# Patient Record
Sex: Male | Born: 1960 | Race: Black or African American | Hispanic: No | Smoking: Former smoker
Health system: Southern US, Community
[De-identification: ages and names within clinical notes are randomized; demographics above are authoritative.]

## PROBLEM LIST (undated history)

## (undated) DIAGNOSIS — F329 Major depressive disorder, single episode, unspecified: Secondary | ICD-10-CM

## (undated) DIAGNOSIS — F32A Depression, unspecified: Secondary | ICD-10-CM

## (undated) DIAGNOSIS — F419 Anxiety disorder, unspecified: Secondary | ICD-10-CM

## (undated) DIAGNOSIS — I10 Essential (primary) hypertension: Secondary | ICD-10-CM

## (undated) DIAGNOSIS — F909 Attention-deficit hyperactivity disorder, unspecified type: Secondary | ICD-10-CM

---

## 2006-04-16 ENCOUNTER — Emergency Department (HOSPITAL_COMMUNITY): Admission: EM | Admit: 2006-04-16 | Discharge: 2006-04-16 | Payer: Self-pay | Admitting: Emergency Medicine

## 2006-05-12 ENCOUNTER — Emergency Department (HOSPITAL_COMMUNITY): Admission: EM | Admit: 2006-05-12 | Discharge: 2006-05-12 | Payer: Self-pay | Admitting: *Deleted

## 2007-09-03 ENCOUNTER — Emergency Department (HOSPITAL_COMMUNITY): Admission: EM | Admit: 2007-09-03 | Discharge: 2007-09-03 | Payer: Self-pay | Admitting: Emergency Medicine

## 2007-10-20 ENCOUNTER — Ambulatory Visit: Payer: Self-pay | Admitting: Internal Medicine

## 2007-10-21 ENCOUNTER — Ambulatory Visit: Payer: Self-pay | Admitting: *Deleted

## 2008-02-29 ENCOUNTER — Ambulatory Visit: Payer: Self-pay | Admitting: Internal Medicine

## 2008-03-06 ENCOUNTER — Emergency Department (HOSPITAL_COMMUNITY): Admission: EM | Admit: 2008-03-06 | Discharge: 2008-03-06 | Payer: Self-pay | Admitting: Emergency Medicine

## 2008-12-28 IMAGING — CR DG CHEST 2V
2 series · 2 of 2 positions shown · non-contrast
Comparison: None.

CLINICAL DATA: Chest pain and pressure.

CHEST - 2 VIEW  09/03/2007:

[w chest pa]
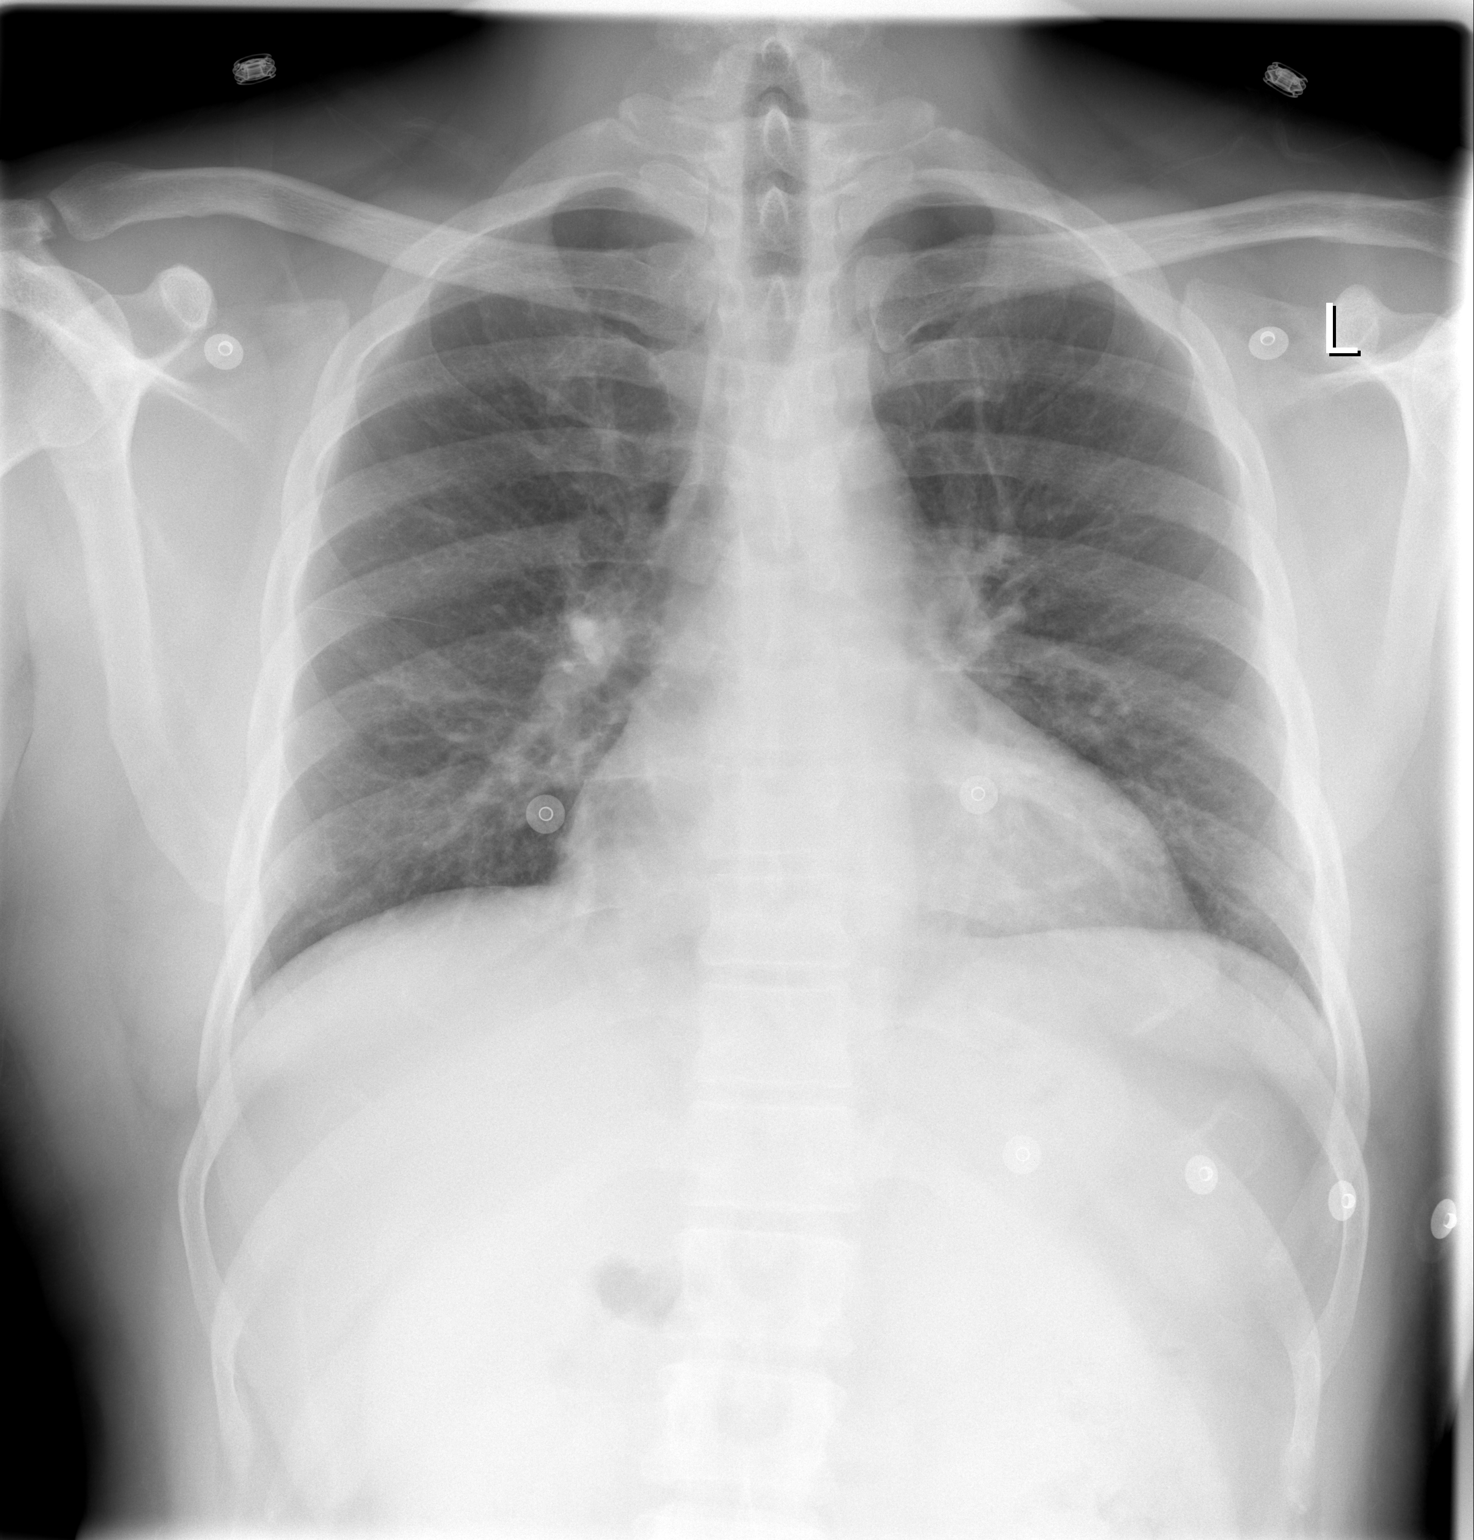

[w chest lat]
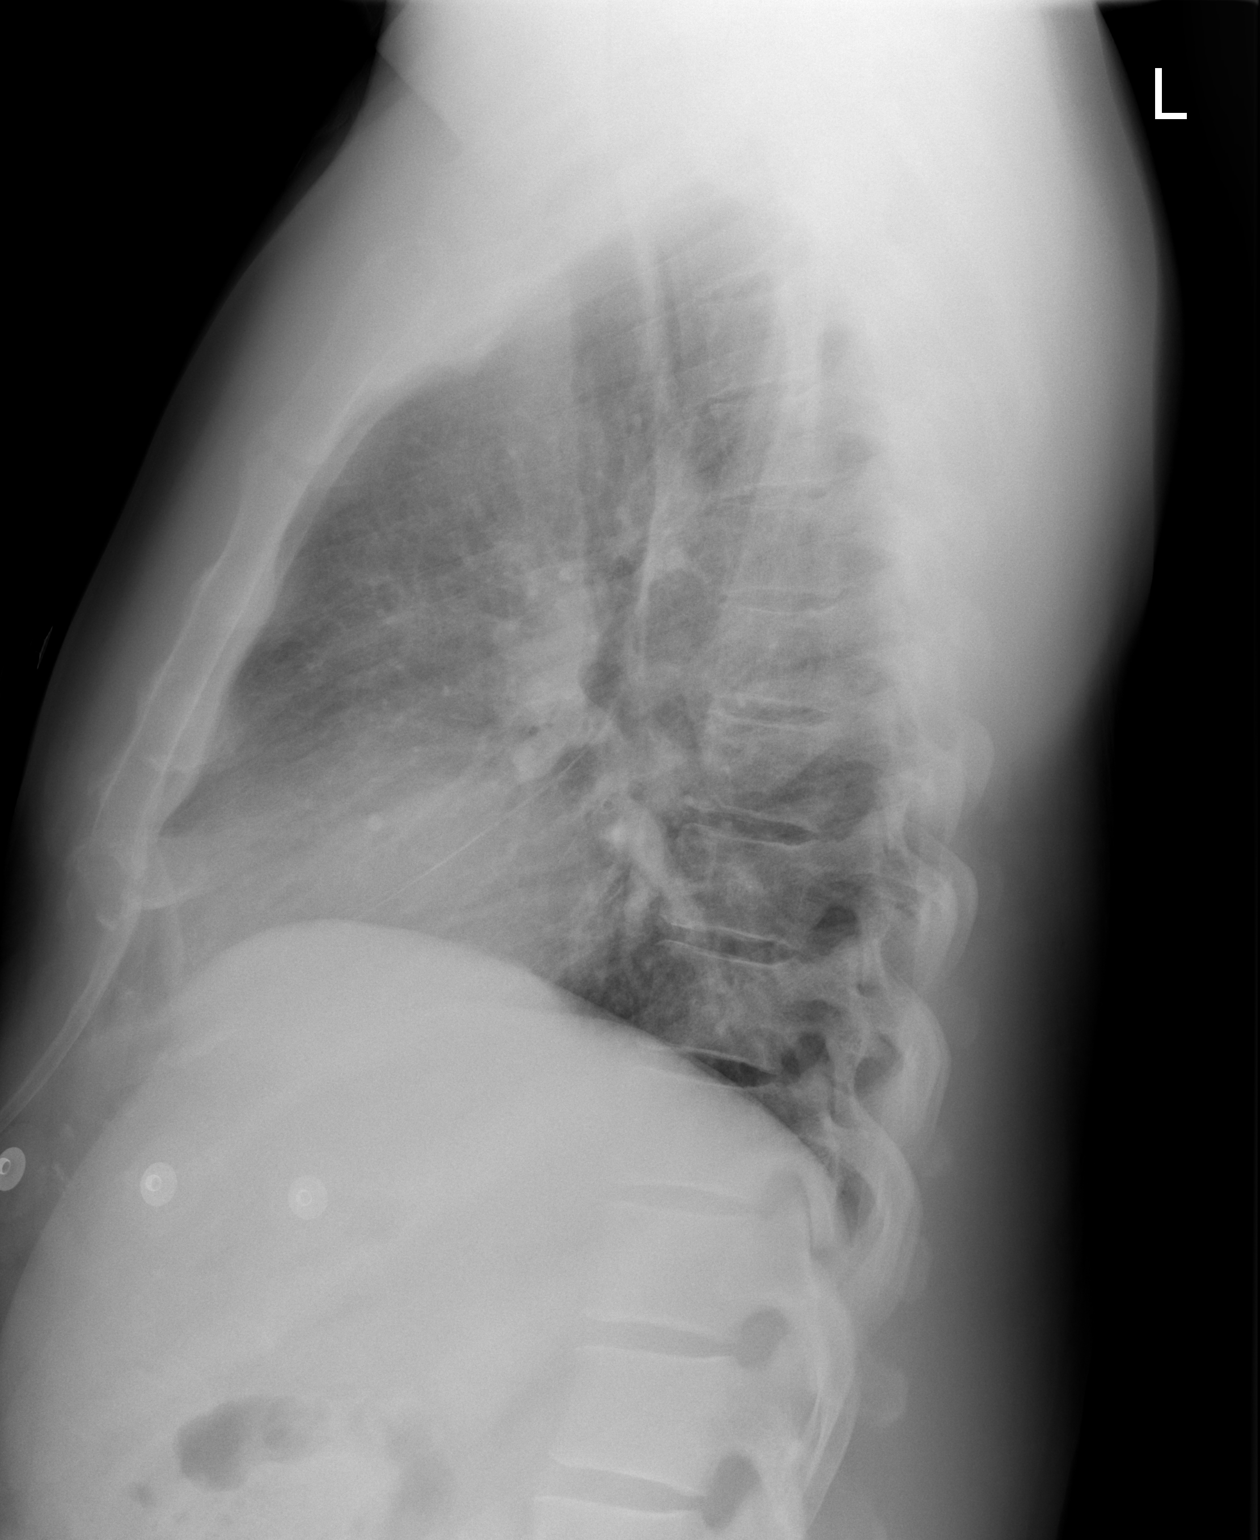

[2 of 2 positions shown; findings below may reference images not displayed]

FINDINGS: Inspiration suboptimal accounting for crowded bronchovascular
markings at the bases and accentuating the heart size; taking this into account,
heart mildly enlarged and pulmonary parenchyma clear. Pulmonary vascularity
normal. No pleural effusions. Visualized bony thorax intact.
IMPRESSION: Heart mildly enlarged for age. No acute cardiopulmonary disease.

## 2009-01-11 ENCOUNTER — Ambulatory Visit: Payer: Self-pay | Admitting: Nurse Practitioner

## 2009-01-11 DIAGNOSIS — R252 Cramp and spasm: Secondary | ICD-10-CM

## 2009-01-11 LAB — CONVERTED CEMR LAB
Albumin: 4.2 g/dL (ref 3.5–5.2)
BUN: 10 mg/dL (ref 6–23)
Basophils Absolute: 0 10*3/uL (ref 0.0–0.1)
Basophils Relative: 0 % (ref 0–1)
Bilirubin Urine: NEGATIVE
CO2: 19 meq/L (ref 19–32)
CRP, High Sensitivity: 7.9 — ABNORMAL HIGH
Calcium: 9.2 mg/dL (ref 8.4–10.5)
Eosinophils Absolute: 0.2 10*3/uL (ref 0.0–0.7)
Glucose, Bld: 87 mg/dL (ref 70–99)
Glucose, Urine, Semiquant: NEGATIVE
HCV Ab: NEGATIVE
Homocysteine: 10.2 micromoles/L (ref 4.0–15.4)
Ketones, urine, test strip: NEGATIVE
Lymphocytes Relative: 33 % (ref 12–46)
Lymphs Abs: 2.6 10*3/uL (ref 0.7–4.0)
MCV: 82.2 fL (ref 78.0–100.0)
Neutro Abs: 4.5 10*3/uL (ref 1.7–7.7)
Neutrophils Relative %: 56 % (ref 43–77)
Nitrite: NEGATIVE
Platelets: 303 10*3/uL (ref 150–400)
Specific Gravity, Urine: <1.005
Total Bilirubin: 0.5 mg/dL (ref 0.3–1.2)
Urobilinogen, UA: 0.2
WBC: 8 10*3/uL (ref 4.0–10.5)
pH: 7

## 2009-02-08 ENCOUNTER — Ambulatory Visit: Payer: Self-pay | Admitting: Nurse Practitioner

## 2009-02-08 DIAGNOSIS — K921 Melena: Secondary | ICD-10-CM

## 2009-02-08 LAB — CONVERTED CEMR LAB
Bilirubin Urine: NEGATIVE
Glucose, Urine, Semiquant: NEGATIVE
Nitrite: NEGATIVE
OCCULT 1: POSITIVE
Specific Gravity, Urine: <1.005
pH: 6.5

## 2009-02-26 ENCOUNTER — Encounter (INDEPENDENT_AMBULATORY_CARE_PROVIDER_SITE_OTHER): Payer: Self-pay | Admitting: Nurse Practitioner

## 2011-06-12 LAB — URINALYSIS, ROUTINE W REFLEX MICROSCOPIC
Bilirubin Urine: NEGATIVE
Specific Gravity, Urine: 1.003 — ABNORMAL LOW
Urobilinogen, UA: 0.2

## 2011-06-12 LAB — CBC
HCT: 45.4
RBC: 5.51

## 2011-06-12 LAB — DIFFERENTIAL
Basophils Absolute: 0
Basophils Relative: 0
Eosinophils Absolute: 0.3
Lymphocytes Relative: 20
Lymphs Abs: 1.8
Monocytes Absolute: 0.8
Monocytes Relative: 9
Neutro Abs: 6.2

## 2011-06-12 LAB — POCT I-STAT, CHEM 8
BUN: 9
Chloride: 106
Creatinine, Ser: 1.3
Glucose, Bld: 111 — ABNORMAL HIGH
Potassium: 3.7
Sodium: 139

## 2011-06-20 LAB — POCT CARDIAC MARKERS
CKMB, poc: 3.9
CKMB, poc: 4.5
Myoglobin, poc: 164
Myoglobin, poc: 197
Operator id: 161631
Operator id: 161631
Operator id: 161631
Troponin i, poc: <0.05

## 2011-06-20 LAB — I-STAT 8, (EC8 V) (CONVERTED LAB)
BUN: 13
Chloride: 108
HCT: 50

## 2012-08-27 ENCOUNTER — Emergency Department (HOSPITAL_COMMUNITY)
Admission: EM | Admit: 2012-08-27 | Discharge: 2012-08-27 | Discharge status: Against Medical Advice | Payer: Self-pay | Attending: Emergency Medicine | Admitting: Emergency Medicine

## 2012-08-27 ENCOUNTER — Encounter (HOSPITAL_COMMUNITY): Payer: Self-pay | Admitting: Emergency Medicine

## 2012-08-27 DIAGNOSIS — R0602 Shortness of breath: Secondary | ICD-10-CM | POA: Insufficient documentation

## 2012-08-27 DIAGNOSIS — Y929 Unspecified place or not applicable: Secondary | ICD-10-CM | POA: Insufficient documentation

## 2012-08-27 DIAGNOSIS — T17208A Unspecified foreign body in pharynx causing other injury, initial encounter: Secondary | ICD-10-CM | POA: Insufficient documentation

## 2012-08-27 DIAGNOSIS — IMO0002 Reserved for concepts with insufficient information to code with codable children: Secondary | ICD-10-CM | POA: Insufficient documentation

## 2012-08-27 DIAGNOSIS — Y9389 Activity, other specified: Secondary | ICD-10-CM | POA: Insufficient documentation

## 2012-08-27 HISTORY — DX: Major depressive disorder, single episode, unspecified: F32.9

## 2012-08-27 HISTORY — DX: Essential (primary) hypertension: I10

## 2012-08-27 HISTORY — DX: Depression, unspecified: F32.A

## 2012-08-27 HISTORY — DX: Attention-deficit hyperactivity disorder, unspecified type: F90.9

## 2012-08-27 NOTE — ED Notes (Signed)
No answer x3

## 2012-08-27 NOTE — ED Notes (Signed)
Pt states he was eating chicken approx 40 min ago and feels like bone stuck in throat.  States he was sob earlier but denies at this time.  Denies sore throat.  States he can feel it when swallowing.  Denies difficulty swallowing.  Patient handling secretions without difficulty.

## 2012-12-04 ENCOUNTER — Emergency Department (HOSPITAL_COMMUNITY)
Admission: EM | Admit: 2012-12-04 | Discharge: 2012-12-04 | Disposition: A | Discharge status: Home / Self Care | Payer: Self-pay | Attending: Emergency Medicine | Admitting: Emergency Medicine

## 2012-12-04 ENCOUNTER — Encounter (HOSPITAL_COMMUNITY): Payer: Self-pay | Admitting: *Deleted

## 2012-12-04 DIAGNOSIS — F329 Major depressive disorder, single episode, unspecified: Secondary | ICD-10-CM | POA: Insufficient documentation

## 2012-12-04 DIAGNOSIS — R6889 Other general symptoms and signs: Secondary | ICD-10-CM | POA: Insufficient documentation

## 2012-12-04 DIAGNOSIS — R51 Headache: Secondary | ICD-10-CM | POA: Insufficient documentation

## 2012-12-04 DIAGNOSIS — J00 Acute nasopharyngitis [common cold]: Secondary | ICD-10-CM | POA: Insufficient documentation

## 2012-12-04 DIAGNOSIS — Z7982 Long term (current) use of aspirin: Secondary | ICD-10-CM | POA: Insufficient documentation

## 2012-12-04 DIAGNOSIS — R519 Headache, unspecified: Secondary | ICD-10-CM

## 2012-12-04 DIAGNOSIS — Z87891 Personal history of nicotine dependence: Secondary | ICD-10-CM | POA: Insufficient documentation

## 2012-12-04 DIAGNOSIS — F3289 Other specified depressive episodes: Secondary | ICD-10-CM | POA: Insufficient documentation

## 2012-12-04 DIAGNOSIS — R42 Dizziness and giddiness: Secondary | ICD-10-CM | POA: Insufficient documentation

## 2012-12-04 DIAGNOSIS — R059 Cough, unspecified: Secondary | ICD-10-CM | POA: Insufficient documentation

## 2012-12-04 DIAGNOSIS — R05 Cough: Secondary | ICD-10-CM | POA: Insufficient documentation

## 2012-12-04 DIAGNOSIS — I10 Essential (primary) hypertension: Secondary | ICD-10-CM | POA: Insufficient documentation

## 2012-12-04 DIAGNOSIS — F909 Attention-deficit hyperactivity disorder, unspecified type: Secondary | ICD-10-CM | POA: Insufficient documentation

## 2012-12-04 DIAGNOSIS — R5383 Other fatigue: Secondary | ICD-10-CM | POA: Insufficient documentation

## 2012-12-04 DIAGNOSIS — R5381 Other malaise: Secondary | ICD-10-CM | POA: Insufficient documentation

## 2012-12-04 DIAGNOSIS — Z79899 Other long term (current) drug therapy: Secondary | ICD-10-CM | POA: Insufficient documentation

## 2012-12-04 MED ORDER — GUAIFENESIN ER 1200 MG PO TB12: 1.0000 | ORAL_TABLET | Freq: Two times a day (BID) | ORAL | Status: DC

## 2012-12-04 MED ORDER — KETOROLAC TROMETHAMINE 30 MG/ML IJ SOLN
30.0000 mg | Freq: Once | INTRAMUSCULAR | Status: AC
  Administered 2012-12-04: 30 mg via INTRAVENOUS
  Filled 2012-12-04: qty 1

## 2012-12-04 MED ORDER — SODIUM CHLORIDE 0.9 % IV BOLUS (SEPSIS)
1000.0000 mL | Freq: Once | INTRAVENOUS | Status: AC
  Administered 2012-12-04: 1000 mL via INTRAVENOUS

## 2012-12-04 MED ORDER — IBUPROFEN 800 MG PO TABS: 800.0000 mg | ORAL_TABLET | Freq: Three times a day (TID) | ORAL | Status: DC | PRN

## 2012-12-04 NOTE — ED Notes (Signed)
Ever since Thurs pt has been feeling like his head was "tight", pressure behind his eyes, general malaise and dizziness.  He thinks his pressures are good today b/c he took a 325 mg asa before coming.

## 2012-12-04 NOTE — ED Provider Notes (Signed)
History     CSN: [id number]  Arrival date & time 12/04/12  Dylan Burgess   First MD Initiated Contact with Patient 12/04/12 1912      Chief Complaint  Patient presents with  . Headache    (Consider location/radiation/quality/duration/timing/severity/associated sxs/prior treatment) HPI Dylan Burgess is a 52 year old male with a history of HTN who presents to the ED with a headache. He states that 2 days ago he developed coryza, cough, sneezing, and general malaise. Yesterday he developed a headache that he states is a "tightness" in the posterior head and pressure behind his eyes. Today he developed some dizziness and feeling lightheaded. He denies near syncope. Today he took an ASA, which relieved the pain from a 10/10 to a 3/10. He denies diplopia, vision changes, trauma, pharyngitis, nausea, vomiting, and neck pain.  Past Medical History  Diagnosis Date  . ADHD (attention deficit hyperactivity disorder)   . Depression   . Hypertension     History reviewed. No pertinent past surgical history.  No family history on file.  History  Substance Use Topics  . Smoking status: Former Games developer  . Smokeless tobacco: Not on file  . Alcohol Use: No      Review of Systems All other systems negative except as documented in the HPI. All pertinent positives and negatives as reviewed in the HPI. Allergies  Review of patient's allergies indicates no known allergies.  Home Medications   Current Outpatient Rx  Name  Route  Sig  Dispense  Refill  . aspirin EC 325 MG tablet   Oral   Take 325 mg by mouth every 6 (six) hours as needed for pain.         . hydrOXYzine (ATARAX/VISTARIL) 25 MG tablet   Oral   Take 50 mg by mouth 3 (three) times daily as needed for itching or anxiety.         . methylphenidate (RITALIN) 20 MG tablet   Oral   Take 20 mg by mouth 2 (two) times daily.         . sertraline (ZOLOFT) 50 MG tablet   Oral   Take 50 mg by mouth daily.         . traZODone  (DESYREL) 100 MG tablet   Oral   Take 100 mg by mouth at bedtime.           BP 138/83  Pulse 75  Temp(Src) 98.5 F (36.9 C) (Oral)  Resp 16  SpO2 95%  Physical Exam  Nursing note and vitals reviewed. Constitutional: He is oriented to person, place, and time. He appears well-developed and well-nourished.  HENT:  Head: Normocephalic and atraumatic.  Mouth/Throat: Oropharynx is clear and moist. No oropharyngeal exudate.  Eyes: EOM are normal. Pupils are equal, round, and reactive to light.  Neck: Normal range of motion. Neck supple.  Cardiovascular: Normal rate, regular rhythm and normal heart sounds.  Exam reveals no gallop and no friction rub.   No murmur heard. Pulmonary/Chest: Effort normal and breath sounds normal.  Neurological: He is alert and oriented to person, place, and time.  Skin: Skin is warm and dry. No rash noted.    ED Course  Procedures (including critical care time) The patient will be treated for URI symptoms. Told to return here as needed.    MDM         Carlyle Dolly, PA-C 12/05/12 1016

## 2012-12-05 NOTE — ED Provider Notes (Signed)
Medical screening examination/treatment/procedure(s) were performed by non-physician practitioner and as supervising physician I was immediately available for consultation/collaboration.   Richardean Canal, MD 12/05/12 [phone]

## 2015-03-22 ENCOUNTER — Encounter (HOSPITAL_COMMUNITY): Payer: Self-pay | Admitting: Emergency Medicine

## 2015-03-22 DIAGNOSIS — M6281 Muscle weakness (generalized): Secondary | ICD-10-CM | POA: Insufficient documentation

## 2015-03-22 DIAGNOSIS — I10 Essential (primary) hypertension: Secondary | ICD-10-CM | POA: Insufficient documentation

## 2015-03-22 DIAGNOSIS — R51 Headache: Secondary | ICD-10-CM | POA: Insufficient documentation

## 2015-03-22 MED ORDER — OXYCODONE-ACETAMINOPHEN 5-325 MG PO TABS
ORAL_TABLET | ORAL | Status: AC
  Filled 2015-03-22: qty 1

## 2015-03-22 MED ORDER — OXYCODONE-ACETAMINOPHEN 5-325 MG PO TABS
1.0000 | ORAL_TABLET | Freq: Once | ORAL | Status: AC
  Administered 2015-03-22: 1 via ORAL

## 2015-03-22 NOTE — ED Notes (Addendum)
Pt states- "I have been taking some old ritalin that I used to take, and then I ate five kit kats. After that I felt a rush. I have anxiety attacks that I used to take medication for but I cannot afford it so now I am trying to treat myself by taking Zoloft, Trazodone, Ritalin and something that starts with a Ph but today I only took the Ritalin to focus on reading." Pt reports after this he took a hot bath and then when he got out he felt stress in the back of his neck and head, left leg and left arm heaviness. Still have the heaviness but it has gotten a little better. Pt also reports neck stiffness that is less intense than it was earlier. Pt states he then "took a 57 Asprin for his nerves."  Pt is alert and oriented X4. No neuro deficits noted.  Denies Chest pain/SOB/N/V/D.

## 2015-03-23 ENCOUNTER — Emergency Department (HOSPITAL_COMMUNITY)
Admission: EM | Admit: 2015-03-23 | Discharge: 2015-03-23 | Discharge status: Against Medical Advice | Payer: Self-pay | Attending: Emergency Medicine | Admitting: Emergency Medicine

## 2015-03-23 HISTORY — DX: Anxiety disorder, unspecified: F41.9

## 2015-03-23 NOTE — ED Notes (Signed)
Called pt to update pts vital signs in waiting room with no answer. Pt called twice

## 2015-03-23 NOTE — ED Notes (Signed)
Pt called for room placement with no answer. 

## 2017-06-21 ENCOUNTER — Emergency Department (HOSPITAL_COMMUNITY): Payer: Self-pay

## 2017-06-21 ENCOUNTER — Encounter (HOSPITAL_COMMUNITY): Payer: Self-pay | Admitting: Emergency Medicine

## 2017-06-21 ENCOUNTER — Emergency Department (HOSPITAL_COMMUNITY)
Admission: EM | Admit: 2017-06-21 | Discharge: 2017-06-21 | Disposition: A | Discharge status: Home / Self Care | Payer: Self-pay | Attending: Emergency Medicine | Admitting: Emergency Medicine

## 2017-06-21 DIAGNOSIS — F909 Attention-deficit hyperactivity disorder, unspecified type: Secondary | ICD-10-CM | POA: Insufficient documentation

## 2017-06-21 DIAGNOSIS — E860 Dehydration: Secondary | ICD-10-CM | POA: Insufficient documentation

## 2017-06-21 DIAGNOSIS — Z87891 Personal history of nicotine dependence: Secondary | ICD-10-CM | POA: Insufficient documentation

## 2017-06-21 DIAGNOSIS — Z79899 Other long term (current) drug therapy: Secondary | ICD-10-CM | POA: Insufficient documentation

## 2017-06-21 DIAGNOSIS — I10 Essential (primary) hypertension: Secondary | ICD-10-CM | POA: Insufficient documentation

## 2017-06-21 DIAGNOSIS — R55 Syncope and collapse: Secondary | ICD-10-CM | POA: Insufficient documentation

## 2017-06-21 LAB — CBC WITH DIFFERENTIAL/PLATELET
BASOS ABS: 0 10*3/uL (ref 0.0–0.1)
BASOS PCT: 0 %
EOS ABS: 0.2 10*3/uL (ref 0.0–0.7)
EOS PCT: 2 %
HCT: 41.6 % (ref 39.0–52.0)
Hemoglobin: 13.5 g/dL (ref 13.0–17.0)
Lymphocytes Relative: 22 %
Lymphs Abs: 1.9 10*3/uL (ref 0.7–4.0)
MCH: 27.1 pg (ref 26.0–34.0)
MCHC: 32.5 g/dL (ref 30.0–36.0)
MCV: 83.4 fL (ref 78.0–100.0)
MONO ABS: 0.5 10*3/uL (ref 0.1–1.0)
MONOS PCT: 6 %
NEUTROS ABS: 5.9 10*3/uL (ref 1.7–7.7)
Neutrophils Relative %: 70 %
PLATELETS: 232 10*3/uL (ref 150–400)
RBC: 4.99 MIL/uL (ref 4.22–5.81)
RDW: 13.6 % (ref 11.5–15.5)
WBC: 8.5 10*3/uL (ref 4.0–10.5)

## 2017-06-21 LAB — COMPREHENSIVE METABOLIC PANEL
ALK PHOS: 65 U/L (ref 38–126)
ALT: 20 U/L (ref 17–63)
AST: 21 U/L (ref 15–41)
Albumin: 3.7 g/dL (ref 3.5–5.0)
Anion gap: 6 (ref 5–15)
BUN: 14 mg/dL (ref 6–20)
CALCIUM: 8.3 mg/dL — AB (ref 8.9–10.3)
CHLORIDE: 107 mmol/L (ref 101–111)
CO2: 25 mmol/L (ref 22–32)
CREATININE: 1.33 mg/dL — AB (ref 0.61–1.24)
GFR calc Af Amer: >60 mL/min (ref >60)
GFR calc non Af Amer: 60 mL/min — ABNORMAL LOW (ref >60)
GLUCOSE: 135 mg/dL — AB (ref 65–99)
Potassium: 3.7 mmol/L (ref 3.5–5.1)
SODIUM: 138 mmol/L (ref 135–145)
Total Bilirubin: 0.6 mg/dL (ref 0.3–1.2)
Total Protein: 7 g/dL (ref 6.5–8.1)

## 2017-06-21 LAB — URINALYSIS, ROUTINE W REFLEX MICROSCOPIC
BILIRUBIN URINE: NEGATIVE
GLUCOSE, UA: NEGATIVE mg/dL
HGB URINE DIPSTICK: NEGATIVE
Ketones, ur: NEGATIVE mg/dL
Leukocytes, UA: NEGATIVE
Nitrite: NEGATIVE
PH: 7 (ref 5.0–8.0)
Protein, ur: NEGATIVE mg/dL
SPECIFIC GRAVITY, URINE: 1.003 — AB (ref 1.005–1.030)

## 2017-06-21 LAB — I-STAT TROPONIN, ED
Troponin i, poc: 0 ng/mL (ref 0.00–0.08)
Troponin i, poc: 0 ng/mL (ref 0.00–0.08)

## 2017-06-21 NOTE — ED Triage Notes (Signed)
Per EMS: Pt was standing up using the bathroom and when he finished urinating he slumped to the ground. Wife witnessed.  Pt denies head/neck/back pain. Initial BP was 60/26 w/ HR 38, after 1000NS 122/72 w/ HR 60. A&Ox4 VSS EKG NSR CBG 118

## 2017-06-21 NOTE — ED Notes (Signed)
Pt understood dc material and follow up instructions. NAD noted

## 2017-06-21 NOTE — Discharge Instructions (Signed)
Please stay hydrated. Please follow-up with a primary care physician for reassessment and further management in the next few days. If any symptoms change or worsen, please return to the nearest emergency department.

## 2017-06-21 NOTE — ED Provider Notes (Signed)
[facility]-EMERGENCY DEPT Provider Note   CSN: [id number] Arrival date & time: 06/21/17  1311     History   Chief Complaint Chief Complaint  Patient presents with  . Loss of Consciousness    HPI Dylan Burgess is a 56 y.o. male.  The history is provided by the patient. No language interpreter was used.  Loss of Consciousness   This is a new problem. The current episode started less than 1 hour ago. The problem occurs constantly. The problem has been gradually improving. There was no loss of consciousness. Associated symptoms include bladder incontinence. He has tried nothing for the symptoms. His past medical history does not include CVA.  Pt reports he felt nauseated.  Pt thinks he may have gotten dehydrated from not drinking enough fluids.  Pt reports he felt like he was going to vomit and like he needed to urinate. Pt's wife reports pt urinated on himself. She called EMS.  Pt reports he had a period of time that he could here EMS but could not answer.  Pt complains of feeling thirsty.  Past Medical History:  Diagnosis Date  . ADHD (attention deficit hyperactivity disorder)   . Anxiety   . Depression   . Hypertension     Patient Active Problem List   Diagnosis Date Noted  . HEMOCCULT POSITIVE STOOL 02/08/2009  . HYPERTENSION, BENIGN ESSENTIAL 01/11/2009  . MUSCLE CRAMPS 01/11/2009    History reviewed. No pertinent surgical history.     Home Medications    Prior to Admission medications   Medication Sig Start Date End Date Taking? Authorizing Provider  aspirin EC 325 MG tablet Take 325 mg by mouth every 6 (six) hours as needed for pain.    [provider]  Guaifenesin 1200 MG TB12 Take 1 tablet (1,200 mg total) by mouth 2 (two) times daily. 12/04/12   Lawyer, Cristal Deer, PA-C  hydrOXYzine (ATARAX/VISTARIL) 25 MG tablet Take 50 mg by mouth 3 (three) times daily as needed for itching or anxiety.    [provider]  ibuprofen (ADVIL,MOTRIN) 800 MG tablet  Take 1 tablet (800 mg total) by mouth every 8 (eight) hours as needed for pain. 12/04/12   Lawyer, Cristal Deer, PA-C  methylphenidate (RITALIN) 20 MG tablet Take 20 mg by mouth 2 (two) times daily.    [provider]  sertraline (ZOLOFT) 50 MG tablet Take 50 mg by mouth daily.    [provider]  traZODone (DESYREL) 100 MG tablet Take 100 mg by mouth at bedtime.    [provider]    Family History No family history on file.  Social History Social History  Substance Use Topics  . Smoking status: Former Games developer  . Smokeless tobacco: Never Used  . Alcohol use No     Allergies   Patient has no known allergies.   Review of Systems Review of Systems  Cardiovascular: Positive for syncope.  Genitourinary: Positive for bladder incontinence.  All other systems reviewed and are negative.    Physical Exam Updated Vital Signs BP 126/79   Pulse 69   Temp (!) 97.5 F (36.4 C) (Oral)   Resp 13   Ht 6' (1.829 m)   Wt 95.3 kg (210 lb)   SpO2 100%   BMI 28.48 kg/m   Physical Exam  Constitutional: He appears well-developed and well-nourished.  HENT:  Head: Normocephalic and atraumatic.  Eyes: Conjunctivae are normal.  Neck: Neck supple.  Cardiovascular: Normal rate and regular rhythm.   No murmur heard. Pulmonary/Chest:  Effort normal and breath sounds normal. No respiratory distress.  Abdominal: Soft. There is no tenderness.  Musculoskeletal: He exhibits no edema.  Neurological: He is alert.  Skin: Skin is warm and dry.  Psychiatric: He has a normal mood and affect.  Nursing note and vitals reviewed.    ED Treatments / Results  Labs (all labs ordered are listed, but only abnormal results are displayed) Labs Reviewed  CBC WITH DIFFERENTIAL/PLATELET  COMPREHENSIVE METABOLIC PANEL  URINALYSIS, ROUTINE W REFLEX MICROSCOPIC  I-STAT TROPONIN, ED    EKG  EKG Interpretation  Date/Time:  Sunday June 21 2017 13:19:51 EDT Ventricular Rate:   60 PR Interval:    QRS Duration: 104 QT Interval:  424 QTC Calculation: 424 R Axis:   -25 Text Interpretation:  Sinus rhythm Prolonged PR interval Borderline left axis deviation Since last tracing rate slower Confirmed by Eber Hong ([id number]) on 06/21/2017 1:24:11 PM       Radiology No results found.  Procedures Procedures (including critical care time)  Medications Ordered in ED Medications - No data to display   Initial Impression / Assessment and Plan / ED Course  I have reviewed the triage vital signs and the nursing notes.  Pertinent labs & imaging results that were available during my care of the patient were reviewed by me and considered in my medical decision making (see chart for details).       Final Clinical Impressions(s) / ED Diagnoses   Final diagnoses:  None    New Prescriptions New Prescriptions   No medications on file     Osie Cheeks 06/21/17 1601    Eber Hong, MD 06/23/17 2014

## 2017-06-21 NOTE — ED Provider Notes (Signed)
Care assumed from [employer] PA-C.   Patient is awaiting delta troponin and reassessment. Patient reports having a near-syncopal episode after being dehydrated and having urination. Patient was given fluids by EMS and has not been hypotensive during his emergency department stay. At time of transfer of care, plan to discharge patient if troponin is negative 2 and his symptoms have improved and he tolerates rehydration orally.   The troponin was negative. Patient's workup was reviewed and patient is feeling better. Patient was able to tolerate a full meal and drinking without difficulty. Patient was able to ambulate without feeling lightheaded or having any further near syncopal episodes. Patient continues to deny chest pain or shortness of breath.   Given reassuring workup, do not feel patient needs admission at this time. Patient encouraged to stay hydrated and follow-up with his PCP. Patient had no other questions or concerns and understood return precautions. Patient discharged in good condition.     Clinical Impression: 1. Near syncope   2. Dehydration     Disposition: Discharge  Condition: Good  I have discussed the results, Dx and Tx plan with the pt(& family if present). He/she/they expressed understanding and agree(s) with the plan. Discharge instructions discussed at great length. Strict return precautions discussed and pt &/or family have verbalized understanding of the instructions. No further questions at time of discharge.    Discharge Medication List as of 06/21/2017  9:20 PM      Follow Up: [facility] [address] [place] [address] [phone] Schedule an appointment as soon as possible for a visit    [facility] EMERGENCY DEPARTMENT [address] [id number] mc [place] [address] [phone]  If symptoms worsen     Kaavya Puskarich, Canary Brim, MD 06/22/17 0025

## 2017-12-01 ENCOUNTER — Emergency Department (HOSPITAL_COMMUNITY)
Admission: EM | Admit: 2017-12-01 | Discharge: 2017-12-01 | Disposition: A | Discharge status: Home / Self Care | Payer: Self-pay | Attending: Emergency Medicine | Admitting: Emergency Medicine

## 2017-12-01 ENCOUNTER — Encounter (HOSPITAL_COMMUNITY): Payer: Self-pay | Admitting: Emergency Medicine

## 2017-12-01 DIAGNOSIS — F909 Attention-deficit hyperactivity disorder, unspecified type: Secondary | ICD-10-CM | POA: Insufficient documentation

## 2017-12-01 DIAGNOSIS — Z87891 Personal history of nicotine dependence: Secondary | ICD-10-CM | POA: Insufficient documentation

## 2017-12-01 DIAGNOSIS — I10 Essential (primary) hypertension: Secondary | ICD-10-CM | POA: Insufficient documentation

## 2017-12-01 DIAGNOSIS — R002 Palpitations: Secondary | ICD-10-CM | POA: Insufficient documentation

## 2017-12-01 LAB — COMPREHENSIVE METABOLIC PANEL
ALBUMIN: 3.8 g/dL (ref 3.5–5.0)
ALK PHOS: 72 U/L (ref 38–126)
ALT: 21 U/L (ref 17–63)
ANION GAP: 12 (ref 5–15)
AST: 25 U/L (ref 15–41)
BUN: 13 mg/dL (ref 6–20)
CO2: 22 mmol/L (ref 22–32)
Calcium: 8.6 mg/dL — ABNORMAL LOW (ref 8.9–10.3)
Chloride: 104 mmol/L (ref 101–111)
Creatinine, Ser: 1.18 mg/dL (ref 0.61–1.24)
GFR calc Af Amer: >60 mL/min (ref >60)
GFR calc non Af Amer: >60 mL/min (ref >60)
GLUCOSE: 93 mg/dL (ref 65–99)
POTASSIUM: 3.4 mmol/L — AB (ref 3.5–5.1)
SODIUM: 138 mmol/L (ref 135–145)
Total Bilirubin: 0.3 mg/dL (ref 0.3–1.2)
Total Protein: 7.5 g/dL (ref 6.5–8.1)

## 2017-12-01 LAB — CBC WITH DIFFERENTIAL/PLATELET
BASOS ABS: 0 10*3/uL (ref 0.0–0.1)
Basophils Relative: 0 %
Eosinophils Absolute: 0.2 10*3/uL (ref 0.0–0.7)
Eosinophils Relative: 2 %
HEMATOCRIT: 47 % (ref 39.0–52.0)
Hemoglobin: 15.6 g/dL (ref 13.0–17.0)
LYMPHS ABS: 2.8 10*3/uL (ref 0.7–4.0)
LYMPHS PCT: 28 %
MCH: 27.9 pg (ref 26.0–34.0)
MCHC: 33.2 g/dL (ref 30.0–36.0)
MCV: 84.1 fL (ref 78.0–100.0)
Monocytes Absolute: 0.9 10*3/uL (ref 0.1–1.0)
Monocytes Relative: 9 %
NEUTROS ABS: 6 10*3/uL (ref 1.7–7.7)
Neutrophils Relative %: 61 %
Platelets: 284 10*3/uL (ref 150–400)
RBC: 5.59 MIL/uL (ref 4.22–5.81)
RDW: 13.8 % (ref 11.5–15.5)
WBC: 9.8 10*3/uL (ref 4.0–10.5)

## 2017-12-01 LAB — PROTIME-INR
INR: 0.97
Prothrombin Time: 12.8 seconds (ref 11.4–15.2)

## 2017-12-01 LAB — TROPONIN I: Troponin I: <0.03 ng/mL (ref <0.03)

## 2017-12-01 LAB — CBG MONITORING, ED: Glucose-Capillary: 85 mg/dL (ref 65–99)

## 2017-12-01 LAB — BRAIN NATRIURETIC PEPTIDE: B NATRIURETIC PEPTIDE 5: 13.7 pg/mL (ref 0.0–100.0)

## 2017-12-01 LAB — MAGNESIUM: Magnesium: 2.1 mg/dL (ref 1.7–2.4)

## 2017-12-01 MED ORDER — ASPIRIN 81 MG PO CHEW
324.0000 mg | CHEWABLE_TABLET | Freq: Once | ORAL | Status: AC
  Administered 2017-12-01: 324 mg via ORAL
  Filled 2017-12-01: qty 4

## 2017-12-01 NOTE — Discharge Instructions (Signed)
As discussed, today's evaluation has been generally reassuring, but there is some suspicion for an arrhythmia, or irregular heartbeat. It is important he follow-up with our cardiology colleagues for further evaluation and management.  Return here for any concerning changes on your condition.

## 2017-12-01 NOTE — ED Notes (Signed)
Pt refused d/c vitals.

## 2017-12-01 NOTE — ED Notes (Signed)
Pt ambulated to bathroom independently

## 2017-12-01 NOTE — ED Provider Notes (Signed)
[facility]   CSN: [id number] Arrival date & time:        History   Chief Complaint Chief Complaint  Patient presents with  . Tachycardia    HPI Dylan Burgess is a [age] y.o. male.  HPI Presents after an episode of chest pain with palpitations. Patient was in his usual state of health, notes that soon after going to his house after doing some manual labor, he felt onset of rapid heartbeat, palpitations, tightness across the chest. Symptoms lasted possibly 10 minutes, have resolved entirely, by the time of my evaluation. EMS notes that the patient in sinus rhythm, with tachycardia during transport, but no other arrhythmia on the monitor. Patient states that he is a healthy individual, denies chronic medical problems, denies recent changes in activity, diet, medicine. Currently again he denies any ongoing complaints including pain, lightheadedness.  Past Medical History:  Diagnosis Date  . ADHD (attention deficit hyperactivity disorder)   . Anxiety   . Depression   . Hypertension     Patient Active Problem List   Diagnosis Date Noted  . HEMOCCULT POSITIVE STOOL 02/08/2009  . HYPERTENSION, BENIGN ESSENTIAL 01/11/2009  . MUSCLE CRAMPS 01/11/2009    History reviewed. No pertinent surgical history.     Home Medications    Prior to Admission medications   Medication Sig Start Date End Date Taking? Authorizing Provider  Guaifenesin 1200 MG TB12 Take 1 tablet (1,200 mg total) by mouth 2 (two) times daily. Patient not taking: Reported on 06/21/2017 12/04/12   Charlestine NightLawyer, Christopher, PA-C  ibuprofen (ADVIL,MOTRIN) 800 MG tablet Take 1 tablet (800 mg total) by mouth every 8 (eight) hours as needed for pain. Patient not taking: Reported on 06/21/2017 12/04/12   Charlestine NightLawyer, Christopher, PA-C    Family History No family history on file.  Social History Social History   Tobacco Use  . Smoking status: Former Games developermoker  .  Smokeless tobacco: Never Used  Substance Use Topics  . Alcohol use: No  . Drug use: No     Allergies   Patient has no known allergies.   Review of Systems Review of Systems  Constitutional:       Per HPI, otherwise negative  HENT:       Per HPI, otherwise negative  Respiratory:       Per HPI, otherwise negative  Cardiovascular:       Per HPI, otherwise negative  Gastrointestinal: Negative for vomiting.  Endocrine:       Negative aside from HPI  Genitourinary:       Neg aside from HPI   Musculoskeletal:       Per HPI, otherwise negative  Skin: Negative.   Neurological: Negative for syncope.     Physical Exam Updated Vital Signs BP (!) 187/101 (BP Location: Right Arm)   Pulse 77   Temp 98.6 F (37 C) (Oral)   Resp 18   Ht 6' (1.829 m)   Wt 102.1 kg (225 lb)   SpO2 95%   BMI 30.52 kg/m   Physical Exam  Constitutional: He is oriented to person, place, and time. He appears well-developed. No distress.  HENT:  Head: Normocephalic and atraumatic.  Eyes: Conjunctivae and EOM are normal.  Cardiovascular: Normal rate, regular rhythm and intact distal pulses.  Pulmonary/Chest: Effort normal. No stridor. No respiratory distress.  Abdominal: He exhibits no distension.  Musculoskeletal: He exhibits no edema.  Neurological: He is alert and oriented to person, place, and time.  Skin: Skin is warm and dry.  Psychiatric: He has a normal mood and affect.  Nursing note and vitals reviewed.    ED Treatments / Results  Labs (all labs ordered are listed, but only abnormal results are displayed) Labs Reviewed  COMPREHENSIVE METABOLIC PANEL - Abnormal; Notable for the following components:      Result Value   Potassium 3.4 (*)    Calcium 8.6 (*)    All other components within normal limits  MAGNESIUM  TROPONIN I  CBC WITH DIFFERENTIAL/PLATELET  PROTIME-INR  BRAIN NATRIURETIC PEPTIDE  CBG MONITORING, ED    EKG  EKG Interpretation  Date/Time:  Tuesday December 01 2017 19:33:49 EDT Ventricular Rate:  73 PR Interval:  200 QRS Duration: 92 QT Interval:  402 QTC Calculation: 442 R Axis:   -28 Text Interpretation:  Normal sinus rhythm No significant change since last tracing Abnormal ekg Confirmed by Gerhard Munch [phone]) on 12/01/2017 8:53:51 PM       Procedures Procedures (including critical care time)  Medications Ordered in ED Medications  aspirin chewable tablet 324 mg (324 mg Oral Given 12/01/17 1933)     Initial Impression / Assessment and Plan / ED Course  I have reviewed the triage vital signs and the nursing notes.  Pertinent labs & imaging results that were available during my care of the patient were reviewed by me and considered in my medical decision making (see chart for details).  9:03 PM Patient states that he has to leave, he has a roommate with special needs that he needs to attend to. I discussed the risks of leaving prior to monitoring, second troponin, patient acknowledges understanding of these risks, states he will follow-up with our cardiology colleagues per That said, initial studies reassuring, with no ischemic changes on EKG, unremarkable initial labs, including normal troponin. Patient is also not hypoxic, tachycardic, tachypneic, there is low suspicion for other acute new phenomena such as pulmonary embolism. Given his resolution of symptoms prior to my evaluation, there is some suspicion for paroxysmal atrial fibrillation, but no objective evidence of this as he was asymptomatic while in the emergency department. Patient will follow up with cardiology for consideration of monitoring, in additional testing Patient discharged, per request, in stable condition.  Final Clinical Impressions(s) / ED Diagnoses   Final diagnoses:  Palpitations     Gerhard Munch, MD 12/01/17 2104

## 2017-12-01 NOTE — ED Triage Notes (Signed)
Per EMS pt felt heart racing  And SOB pt does have h/s of anxiety, episode resolved, no pain currently, 12 L unremarkable

## 2018-01-20 ENCOUNTER — Emergency Department (HOSPITAL_COMMUNITY)
Admission: EM | Admit: 2018-01-20 | Discharge: 2018-01-20 | Disposition: A | Discharge status: Home / Self Care | Payer: Self-pay | Attending: Emergency Medicine | Admitting: Emergency Medicine

## 2018-01-20 ENCOUNTER — Encounter (HOSPITAL_COMMUNITY): Payer: Self-pay

## 2018-01-20 DIAGNOSIS — F419 Anxiety disorder, unspecified: Secondary | ICD-10-CM | POA: Insufficient documentation

## 2018-01-20 DIAGNOSIS — Z5321 Procedure and treatment not carried out due to patient leaving prior to being seen by health care provider: Secondary | ICD-10-CM | POA: Insufficient documentation

## 2018-01-20 DIAGNOSIS — R1110 Vomiting, unspecified: Secondary | ICD-10-CM | POA: Insufficient documentation

## 2018-01-20 LAB — COMPREHENSIVE METABOLIC PANEL
ALK PHOS: 74 U/L (ref 38–126)
ALT: 18 U/L (ref 17–63)
ANION GAP: 10 (ref 5–15)
AST: 25 U/L (ref 15–41)
Albumin: 4 g/dL (ref 3.5–5.0)
BILIRUBIN TOTAL: 0.7 mg/dL (ref 0.3–1.2)
BUN: 11 mg/dL (ref 6–20)
CO2: 23 mmol/L (ref 22–32)
Calcium: 8.8 mg/dL — ABNORMAL LOW (ref 8.9–10.3)
Chloride: 106 mmol/L (ref 101–111)
Creatinine, Ser: 1.18 mg/dL (ref 0.61–1.24)
Glucose, Bld: 147 mg/dL — ABNORMAL HIGH (ref 65–99)
Potassium: 3.7 mmol/L (ref 3.5–5.1)
SODIUM: 139 mmol/L (ref 135–145)
TOTAL PROTEIN: 7.7 g/dL (ref 6.5–8.1)

## 2018-01-20 LAB — CBC
HEMATOCRIT: 45.7 % (ref 39.0–52.0)
Hemoglobin: 15.9 g/dL (ref 13.0–17.0)
MCH: 28.9 pg (ref 26.0–34.0)
MCHC: 34.8 g/dL (ref 30.0–36.0)
MCV: 83.1 fL (ref 78.0–100.0)
Platelets: 280 10*3/uL (ref 150–400)
RBC: 5.5 MIL/uL (ref 4.22–5.81)
RDW: 13.3 % (ref 11.5–15.5)
WBC: 9.8 10*3/uL (ref 4.0–10.5)

## 2018-01-20 LAB — LIPASE, BLOOD: Lipase: 50 U/L (ref 11–51)

## 2018-01-20 NOTE — ED Notes (Signed)
Called to reassess. No answer.

## 2018-01-20 NOTE — ED Triage Notes (Signed)
Pt comes via [facility] for a panic attack after he found a bat loose in his house, animal control unable to locate the bat, hx of panic attacks in the past, pt is anxious and vomited x 1 in triage.

## 2018-01-20 NOTE — ED Notes (Signed)
Called pt for VS and urine with no answer

## 2018-01-20 NOTE — ED Notes (Signed)
Called patient no answer.

## 2018-02-12 ENCOUNTER — Encounter (HOSPITAL_COMMUNITY): Payer: Self-pay | Admitting: Emergency Medicine

## 2018-02-12 ENCOUNTER — Emergency Department (HOSPITAL_COMMUNITY): Payer: Self-pay

## 2018-02-12 ENCOUNTER — Emergency Department (HOSPITAL_COMMUNITY)
Admission: EM | Admit: 2018-02-12 | Discharge: 2018-02-12 | Disposition: A | Discharge status: Home / Self Care | Payer: Self-pay | Attending: Emergency Medicine | Admitting: Emergency Medicine

## 2018-02-12 DIAGNOSIS — Z87891 Personal history of nicotine dependence: Secondary | ICD-10-CM | POA: Insufficient documentation

## 2018-02-12 DIAGNOSIS — R002 Palpitations: Secondary | ICD-10-CM | POA: Insufficient documentation

## 2018-02-12 DIAGNOSIS — I10 Essential (primary) hypertension: Secondary | ICD-10-CM | POA: Insufficient documentation

## 2018-02-12 DIAGNOSIS — R0602 Shortness of breath: Secondary | ICD-10-CM | POA: Insufficient documentation

## 2018-02-12 LAB — URINALYSIS, ROUTINE W REFLEX MICROSCOPIC
Bacteria, UA: NONE SEEN
Bilirubin Urine: NEGATIVE
Glucose, UA: NEGATIVE mg/dL
Ketones, ur: NEGATIVE mg/dL
Leukocytes, UA: NEGATIVE
Nitrite: NEGATIVE
Protein, ur: NEGATIVE mg/dL
SPECIFIC GRAVITY, URINE: 1.009 (ref 1.005–1.030)
pH: 6 (ref 5.0–8.0)

## 2018-02-12 LAB — CBC
HCT: 45 % (ref 39.0–52.0)
HEMOGLOBIN: 14.6 g/dL (ref 13.0–17.0)
MCH: 27.2 pg (ref 26.0–34.0)
MCHC: 32.4 g/dL (ref 30.0–36.0)
MCV: 83.8 fL (ref 78.0–100.0)
PLATELETS: 312 10*3/uL (ref 150–400)
RBC: 5.37 MIL/uL (ref 4.22–5.81)
RDW: 13.2 % (ref 11.5–15.5)
WBC: 10.3 10*3/uL (ref 4.0–10.5)

## 2018-02-12 LAB — HEPATIC FUNCTION PANEL
ALK PHOS: 66 U/L (ref 38–126)
ALT: 17 U/L (ref 17–63)
AST: 19 U/L (ref 15–41)
Albumin: 3.9 g/dL (ref 3.5–5.0)
Bilirubin, Direct: <0.1 mg/dL — ABNORMAL LOW (ref 0.1–0.5)
Total Bilirubin: 0.5 mg/dL (ref 0.3–1.2)
Total Protein: 7.2 g/dL (ref 6.5–8.1)

## 2018-02-12 LAB — I-STAT TROPONIN, ED: Troponin i, poc: 0 ng/mL (ref 0.00–0.08)

## 2018-02-12 LAB — BASIC METABOLIC PANEL
Anion gap: 11 (ref 5–15)
BUN: 12 mg/dL (ref 6–20)
CO2: 24 mmol/L (ref 22–32)
CREATININE: 1.19 mg/dL (ref 0.61–1.24)
Calcium: 8.7 mg/dL — ABNORMAL LOW (ref 8.9–10.3)
Chloride: 102 mmol/L (ref 101–111)
GFR calc Af Amer: >60 mL/min (ref >60)
Glucose, Bld: 149 mg/dL — ABNORMAL HIGH (ref 65–99)
Potassium: 3.6 mmol/L (ref 3.5–5.1)
Sodium: 137 mmol/L (ref 135–145)

## 2018-02-12 NOTE — ED Triage Notes (Addendum)
Pt arrives via [facility] from home, c/o sob and dehydration. Pt denied pain, per ems. Pt a/ox4, resp e/u, O2 98% on room air. Hx of anxiety. After triage complete, pt started reporting abdominal pain

## 2018-02-12 NOTE — ED Notes (Signed)
Pt noted to be sitting still on chair when staff not in room, upon staff entering the room, pt began "tremoring"

## 2018-02-12 NOTE — ED Notes (Signed)
Pt ambulated to restroom without difficulty. Gait steady and even.

## 2018-02-12 NOTE — Discharge Instructions (Signed)
Make sure you are staying well-hydrated with water. Follow-up with cardiology for further evaluation of your palpitations and shortness of breath. Return to the emergency room if you develop persistent palpitations, chest pain, difficulty breathing, or any new or concerning symptoms.

## 2018-02-12 NOTE — ED Notes (Signed)
No answer when called to recheck vital signs

## 2018-02-12 NOTE — ED Provider Notes (Signed)
[facility] EMERGENCY DEPARTMENT Provider Note   CSN: [id number] Arrival date & time: 02/12/18  0501     History   Chief Complaint Chief Complaint  Patient presents with  . Shortness of Breath  . Dehydration    HPI Dylan Burgess is a 57 y.o. male presenting for evaluation of palpitations and shortness of breath.  Patient states he was tired yesterday, so went to bed early.  He woke up this morning and felt acute onset palpitations with associated shortness of breath.  This lasted for about 20 minutes before resolving without intervention.  He felt this might be due to dehydration.  He reports urinary frequency, but no hematuria or dysuria.  He states he has had a similar episode in the past, which was attributed to anxiety.  He currently denies feeling short of breath, palpitations, or pain.  He denies fevers, chills, cough, chest pain, nausea, vomiting, abdominal pain, abnormal bowel movements.  He denies leg pain or swelling.  He takes no medications daily.  He has been tolerating p.o. without difficulty.  No change in oral intake.  No increased activity yesterday.  No increased stress or anxiety.  Patient denies recent travel, surgeries, immobilization, trauma, history of hormone use, history of cancer, or history of previous DVT/PE.  HPI  Past Medical History:  Diagnosis Date  . ADHD (attention deficit hyperactivity disorder)   . Anxiety   . Depression   . Hypertension     Patient Active Problem List   Diagnosis Date Noted  . HEMOCCULT POSITIVE STOOL 02/08/2009  . HYPERTENSION, BENIGN ESSENTIAL 01/11/2009  . MUSCLE CRAMPS 01/11/2009    History reviewed. No pertinent surgical history.      Home Medications    Prior to Admission medications   Medication Sig Start Date End Date Taking? Authorizing Provider  aspirin 325 MG tablet Take 325 mg by mouth daily as needed for mild pain.   Yes [provider]  Guaifenesin 1200 MG TB12 Take 1  tablet (1,200 mg total) by mouth 2 (two) times daily. Patient not taking: Reported on 06/21/2017 12/04/12   Charlestine Night, PA-C  ibuprofen (ADVIL,MOTRIN) 800 MG tablet Take 1 tablet (800 mg total) by mouth every 8 (eight) hours as needed for pain. Patient not taking: Reported on 02/12/2018 12/04/12   Charlestine Night, PA-C    Family History No family history on file.  Social History Social History   Tobacco Use  . Smoking status: Former Games developer  . Smokeless tobacco: Never Used  Substance Use Topics  . Alcohol use: No  . Drug use: No     Allergies   Patient has no known allergies.   Review of Systems Review of Systems  Respiratory: Positive for shortness of breath (resolved).   Cardiovascular: Positive for palpitations (Resolved).  Genitourinary: Positive for frequency.  All other systems reviewed and are negative.    Physical Exam Updated Vital Signs BP 139/89   Pulse 62   Temp 98.1 F (36.7 C) (Oral)   Resp 18   SpO2 96%   Physical Exam  Constitutional: He is oriented to person, place, and time. He appears well-developed and well-nourished. No distress.  Sitting comfortably in bed in no apparent distress.  HENT:  Head: Normocephalic and atraumatic.  Mouth/Throat: Uvula is midline, oropharynx is clear and moist and mucous membranes are normal. Mucous membranes are not dry.  Eyes: Pupils are equal, round, and reactive to light. Conjunctivae and EOM are normal.  Neck: Normal range of motion.  Neck supple.  Cardiovascular: Normal rate, regular rhythm and intact distal pulses.  Pulmonary/Chest: Effort normal and breath sounds normal. No respiratory distress. He has no wheezes.  Speaking in full sentences.  Clear lung sounds in all fields.  Abdominal: Soft. He exhibits no distension and no mass. There is no tenderness. There is no guarding.  Abdomen soft without rigidity, guarding, or distention.  No tenderness to palpation.  Musculoskeletal: Normal range of  motion. He exhibits no edema or tenderness.  No leg pain or swelling  Neurological: He is alert and oriented to person, place, and time. No sensory deficit.  Skin: Skin is warm and dry.  Psychiatric: He has a normal mood and affect.  Nursing note and vitals reviewed.    ED Treatments / Results  Labs (all labs ordered are listed, but only abnormal results are displayed) Labs Reviewed  BASIC METABOLIC PANEL - Abnormal; Notable for the following components:      Result Value   Glucose, Bld 149 (*)    Calcium 8.7 (*)    All other components within normal limits  URINALYSIS, ROUTINE W REFLEX MICROSCOPIC - Abnormal; Notable for the following components:   Color, Urine STRAW (*)    Hgb urine dipstick SMALL (*)    All other components within normal limits  HEPATIC FUNCTION PANEL - Abnormal; Notable for the following components:   Bilirubin, Direct <0.1 (*)    All other components within normal limits  CBC  I-STAT TROPONIN, ED    EKG EKG Interpretation  Date/Time:  Friday Feb 12 2018 05:11:30 EDT Ventricular Rate:  61 PR Interval:  190 QRS Duration: 96 QT Interval:  420 QTC Calculation: 422 R Axis:   77 Text Interpretation:  Normal sinus rhythm Normal ECG Artifact No significant change since last tracing Confirmed by Gwyneth Sprout ([id number]) on 02/12/2018 1:37:12 PM   Radiology Dg Chest 2 View  Result Date: 02/12/2018 CLINICAL DATA:  Acute onset of shortness of breath. EXAM: CHEST - 2 VIEW COMPARISON:  Chest radiograph performed 03/06/2008 FINDINGS: The lungs are well-aerated. Mild bibasilar airspace opacities may reflect atelectasis or mild pneumonia. There is no evidence of pleural effusion or pneumothorax. The heart is borderline enlarged. No acute osseous abnormalities are seen. IMPRESSION: Mild bibasilar airspace opacities may reflect atelectasis or mild pneumonia. Borderline cardiomegaly. Electronically Signed   By: Roanna Raider M.D.   On: 02/12/2018 05:46     Procedures Procedures (including critical care time)  Medications Ordered in ED Medications - No data to display   Initial Impression / Assessment and Plan / ED Course  I have reviewed the triage vital signs and the nursing notes.  Pertinent labs & imaging results that were available during my care of the patient were reviewed by me and considered in my medical decision making (see chart for details).     Patient presenting for evaluation of palpitations and shortness of breath which resolved without intervention.  Physical exam shows patient who is afebrile not tachycardic.  Appears nontoxic.  No tachycardia or signs of respiratory distress.  He reports symptoms have resolved.  Will obtain labs and urine, however this is likely related to anxiety.  X-ray shows bibasilar atelectasis versus pneumonia.  As patient is without cough, current shortness of breath, or fever, doubt pneumonia. EKG without STEMI, poor tracing.   UA without infection LFTs ok. Tolerating PO, no recurrence of palpations or SOB. Discussed with pt importance of f/u with cardiology to r/o svt, paf, or other arrhythmia. At this  time, doubt, ACS, PE, infection. Pt appears safe for d/c. Return precautions given. Pt states he understands and agrees to plan.    Final Clinical Impressions(s) / ED Diagnoses   Final diagnoses:  Palpitation  Shortness of breath    ED Discharge Orders    None       Alveria Apley, PA-C 02/12/18 1351    Gwyneth Sprout, MD 02/12/18 2110

## 2018-02-12 NOTE — ED Notes (Signed)
Provided pt with ice water and sprite

## 2018-04-21 ENCOUNTER — Encounter: Payer: Self-pay | Admitting: Internal Medicine

## 2018-04-21 ENCOUNTER — Ambulatory Visit: Payer: Self-pay | Admitting: Internal Medicine

## 2018-04-21 VITALS — BP 150/98 | HR 78 | Resp 12 | Ht 71.0 in | Wt 212.0 lb

## 2018-04-21 DIAGNOSIS — I10 Essential (primary) hypertension: Secondary | ICD-10-CM

## 2018-04-21 DIAGNOSIS — Z13220 Encounter for screening for lipoid disorders: Secondary | ICD-10-CM

## 2018-04-21 DIAGNOSIS — K047 Periapical abscess without sinus: Secondary | ICD-10-CM

## 2018-04-21 DIAGNOSIS — Z125 Encounter for screening for malignant neoplasm of prostate: Secondary | ICD-10-CM

## 2018-04-21 MED ORDER — METOPROLOL TARTRATE 25 MG PO TABS: 25.0000 mg | ORAL_TABLET | Freq: Two times a day (BID) | ORAL | 11 refills | Status: DC

## 2018-04-21 MED ORDER — PENICILLIN V POTASSIUM 250 MG PO TABS: ORAL_TABLET | ORAL | 0 refills | Status: DC

## 2018-04-21 MED ORDER — IBUPROFEN 200 MG PO TABS: ORAL_TABLET | ORAL | 0 refills | Status: DC

## 2018-04-21 NOTE — Progress Notes (Signed)
Subjective:    Patient ID: Dylan Burgess, male    DOB: 1961-01-08, 57 y.o.   MRN: [id number]  HPI   Here to establish  1.  Two areas of dental pain, right lower molar and left upper molar.  Has had pain for more than 1 month.  Both teeth with cavities.  Has swelling of gingiva about both teeth. Has also had swelling on external cheek at times with both teeth.  Has not been treated anywhere else.   Taking only ASA for pain.  Helps sometimes as well as warm packs.  2.  Elevated BP:  Has been told bp high recently.  He is taking Dayquil and Nyquil type medication.   Has been taking the medication intermittently, basically once daily on average in past week.  He last took last night about 2 a.m. As he felt he had body aches, but describes taking the remedy to make sure he got rid of the cold for good.  Looking at repeated ED visits, patient has had elevated bp with anxiety issues in past as well.   3.  Anxiety with palpitations:  Has been seen in ED repeatedly for palpitations.  Not interested in speaking to [facility], LCSW today.    No outpatient medications have been marked as taking for the 04/21/18 encounter (Office Visit) with Julieanne Manson, MD.    No Known Allergies   Past Medical History:  Diagnosis Date  . ADHD (attention deficit hyperactivity disorder)   . Anxiety   . Depression   . Hypertension    History reviewed. No pertinent surgical history.   Family History  Problem Relation Age of Onset  . Kidney disease Mother        was on dialysis at time of death  . Diabetes Mother   . Hypertension Mother   . Hypertension Sister   . Kidney disease Brother        kidney failure on dialysis  . Hypertension Brother        Cause of kidney failure  . Obesity Daughter   . Diabetes Brother   . Hyperlipidemia Brother   . Hyperlipidemia Brother    Social History   Socioeconomic History  . Marital status: Legally Separated    Spouse name: Not on file  . Number  of children: 5  . Years of education: 78  . Highest education level: High school graduate  Occupational History  . Occupation: construction/carpentry  Social Needs  . Financial resource strain: Not very hard  . Food insecurity:    Worry: Sometimes true    Inability: Sometimes true  . Transportation needs:    Medical: Yes    Non-medical: Yes  Tobacco Use  . Smoking status: Former Smoker    Years: 5.00    Types: Cigarettes    Last attempt to quit: 09/21/2002    Years since quitting: 15.5  . Smokeless tobacco: Never Used  Substance and Sexual Activity  . Alcohol use: Yes    Comment: occasional  . Drug use: No  . Sexual activity: Not on file  Lifestyle  . Physical activity:    Days per week: 7 days    Minutes per session: 60 min  . Stress: To some extent  Relationships  . Social connections:    Talks on phone: More than three times a week    Gets together: More than three times a week    Attends religious service: Not on file    Active member of  club or organization: Not on file    Attends meetings of clubs or organizations: Not on file    Relationship status: Not on file  . Intimate partner violence:    Fear of current or ex partner: Not on file    Emotionally abused: Not on file    Physically abused: Not on file    Forced sexual activity: Not on file  Other Topics Concern  . Not on file  Social History Narrative   Originally from [place].   Difficult to get history   Has lived in [place] since 2007.   Lived in [place] since 1990   Children live in [place] or farther away   He currently lives with his girlfriend and 3 dogs.       Review of Systems     Objective:   Physical Exam NAD HEENT:  PERRL, EOMI, Discs sharp, TMs pearly gray, throat without injection.  Dental decay with right lower molar and left upper molar with deep cavities.  Tender with swelling of gingiva surrounding these teeth.  No fluctuance. Neck:  Supple, No adenopathy Chest: CTA CV:   RRR with normal S1 and S2, No S3, S4 or murmur.  No carotid bruits.  Carotid, radial and DP pulses normal and equal. Abd:  S, NT, No HSM or mass, + BS LE:  No edema.        Assessment & Plan:  1.  Hypertension: Metoprolol 25 mg twice daily.  BP check with nurse in 1 week.  2.  Dental abscesses:  Pen V K 250 4 times daily for 7 days.  Ibuprofen as needed for pain.  3.  Anxiety: refuses counseling.  Perhaps Metoprolol will have mild calming effect.  4.  HM:  PSA, FLP, CBC CMP

## 2018-04-22 LAB — CBC WITH DIFFERENTIAL/PLATELET
BASOS: 0 %
Basophils Absolute: 0 10*3/uL (ref 0.0–0.2)
EOS (ABSOLUTE): 0.2 10*3/uL (ref 0.0–0.4)
EOS: 3 %
HEMATOCRIT: 45.6 % (ref 37.5–51.0)
HEMOGLOBIN: 15.3 g/dL (ref 13.0–17.7)
Immature Grans (Abs): 0 10*3/uL (ref 0.0–0.1)
Immature Granulocytes: 0 %
Lymphocytes Absolute: 2.1 10*3/uL (ref 0.7–3.1)
Lymphs: 32 %
MCH: 28.5 pg (ref 26.6–33.0)
MCHC: 33.6 g/dL (ref 31.5–35.7)
MCV: 85 fL (ref 79–97)
MONOS ABS: 0.6 10*3/uL (ref 0.1–0.9)
Monocytes: 10 %
NEUTROS ABS: 3.6 10*3/uL (ref 1.4–7.0)
Neutrophils: 55 %
Platelets: 297 10*3/uL (ref 150–450)
RBC: 5.37 x10E6/uL (ref 4.14–5.80)
RDW: 14.5 % (ref 12.3–15.4)
WBC: 6.6 10*3/uL (ref 3.4–10.8)

## 2018-04-22 LAB — COMPREHENSIVE METABOLIC PANEL
A/G RATIO: 1.6 (ref 1.2–2.2)
ALK PHOS: 71 IU/L (ref 39–117)
ALT: 17 IU/L (ref 0–44)
AST: 18 IU/L (ref 0–40)
Albumin: 4.5 g/dL (ref 3.5–5.5)
BUN/Creatinine Ratio: 11 (ref 9–20)
BUN: 12 mg/dL (ref 6–24)
Bilirubin Total: 0.4 mg/dL (ref 0.0–1.2)
CO2: 25 mmol/L (ref 20–29)
Calcium: 9.1 mg/dL (ref 8.7–10.2)
Chloride: 103 mmol/L (ref 96–106)
Creatinine, Ser: 1.11 mg/dL (ref 0.76–1.27)
GFR calc Af Amer: 87 mL/min/{1.73_m2} (ref >59)
GFR, EST NON AFRICAN AMERICAN: 75 mL/min/{1.73_m2} (ref >59)
GLOBULIN, TOTAL: 2.8 g/dL (ref 1.5–4.5)
Glucose: 94 mg/dL (ref 65–99)
POTASSIUM: 3.7 mmol/L (ref 3.5–5.2)
SODIUM: 145 mmol/L — AB (ref 134–144)
Total Protein: 7.3 g/dL (ref 6.0–8.5)

## 2018-04-22 LAB — LIPID PANEL W/O CHOL/HDL RATIO
CHOLESTEROL TOTAL: 182 mg/dL (ref 100–199)
HDL: 50 mg/dL (ref >39)
LDL CALC: 119 mg/dL — AB (ref 0–99)
TRIGLYCERIDES: 66 mg/dL (ref 0–149)
VLDL Cholesterol Cal: 13 mg/dL (ref 5–40)

## 2018-04-22 LAB — PSA: Prostate Specific Ag, Serum: 1.4 ng/mL (ref 0.0–4.0)

## 2018-05-24 ENCOUNTER — Encounter: Payer: Self-pay | Admitting: Internal Medicine

## 2018-06-02 ENCOUNTER — Encounter: Payer: Self-pay | Admitting: Internal Medicine

## 2018-07-13 ENCOUNTER — Ambulatory Visit: Payer: Self-pay | Admitting: Internal Medicine

## 2018-07-23 ENCOUNTER — Ambulatory Visit: Payer: Self-pay | Admitting: Internal Medicine

## 2018-08-03 ENCOUNTER — Encounter: Payer: Self-pay | Admitting: Internal Medicine

## 2018-08-03 ENCOUNTER — Ambulatory Visit: Payer: Self-pay | Admitting: Internal Medicine

## 2019-03-15 ENCOUNTER — Ambulatory Visit (INDEPENDENT_AMBULATORY_CARE_PROVIDER_SITE_OTHER): Payer: Self-pay | Admitting: Internal Medicine

## 2019-03-15 ENCOUNTER — Encounter: Payer: Self-pay | Admitting: Internal Medicine

## 2019-03-15 ENCOUNTER — Other Ambulatory Visit: Payer: Self-pay

## 2019-03-15 VITALS — BP 152/90 | HR 68 | Resp 12 | Ht 71.0 in | Wt 202.0 lb

## 2019-03-15 DIAGNOSIS — I10 Essential (primary) hypertension: Secondary | ICD-10-CM

## 2019-03-15 DIAGNOSIS — K029 Dental caries, unspecified: Secondary | ICD-10-CM

## 2019-03-15 MED ORDER — LISINOPRIL-HYDROCHLOROTHIAZIDE 20-25 MG PO TABS: ORAL_TABLET | ORAL | 11 refills | Status: DC

## 2019-03-15 NOTE — Patient Instructions (Signed)
Be physically active every day--gradually work up to 60 minutes if possible.

## 2019-03-15 NOTE — Progress Notes (Signed)
    Subjective:    Patient ID: Dylan Burgess, male   DOB: 11-10-1960, 58 y.o.   MRN: [id number]   HPI   1.  Hypertension:  Never followed up with his blood pressure after starting on the Metoprolol.  He states he did have a digital bp monitor at home and still bp was high.   Felt his hands were swelling and that is his marker for not having bp well controlled. The Metoprolol is the first medication he has ever taken for elevated bp.  2.  HM:  In need of Tdap and would like colonoscopy.  Has not applied for orange card yet.  Discussed unable to get a screening colonoscopy through orange card, but can get diagnostic for occult blood in stool.  CBC in August was normal.   He has never performed guaiac card testing.  3.  Dental pain:  Would like referral to dental clinic.  Again, will need the orange card.   Current Meds  Medication Sig  . aspirin EC 81 MG tablet Take 81 mg by mouth daily.   No Known Allergies  Past Medical History:  Diagnosis Date  . ADHD (attention deficit hyperactivity disorder)   . Anxiety   . Depression   . Hypertension      Review of Systems    Objective:   BP (!) 152/90 (BP Location: Left Arm, Patient Position: Sitting, Cuff Size: Normal)   Pulse 68   Resp 12   Ht 5\' 11"  (1.803 m)   Wt 202 lb (91.6 kg)   BMI 28.17 kg/m   Physical Exam  NAD HEENT:  PERRL, EOMI.  Dental decay. Neck:  Supple, No adenopathy Chest:  CTA CV:  RRR without murmur or rub.  Radial and DP pulses normal and equal. LE:  No edema  Assessment & Plan   1.  Hypertension:  Lisinopril/HCTZ 20/25 mg, 1/2 tab by mouth daily. Nurse visit for bp and pulse, BMP in 2 weeks.  2.  HM:  Guaiac card packet x 3 to return in 2 weeks with labs. Did not get Tdap today--will give at lab visit.  3.  Dental decay:  Dental clinic referral.   Needs to get orange card.

## 2019-03-29 ENCOUNTER — Other Ambulatory Visit: Payer: Self-pay

## 2019-04-25 ENCOUNTER — Other Ambulatory Visit: Payer: Self-pay

## 2019-06-15 ENCOUNTER — Ambulatory Visit: Payer: Self-pay | Admitting: Internal Medicine

## 2019-07-19 ENCOUNTER — Emergency Department (HOSPITAL_COMMUNITY)
Admission: EM | Admit: 2019-07-19 | Discharge: 2019-07-20 | Discharge status: Against Medical Advice | Payer: Self-pay | Attending: Emergency Medicine | Admitting: Emergency Medicine

## 2019-07-19 ENCOUNTER — Encounter (HOSPITAL_COMMUNITY): Payer: Self-pay

## 2019-07-19 ENCOUNTER — Other Ambulatory Visit: Payer: Self-pay

## 2019-07-19 DIAGNOSIS — Z5321 Procedure and treatment not carried out due to patient leaving prior to being seen by health care provider: Secondary | ICD-10-CM | POA: Insufficient documentation

## 2019-07-19 MED ORDER — ONDANSETRON 4 MG PO TBDP
ORAL_TABLET | ORAL | Status: AC
  Filled 2019-07-19: qty 1

## 2019-07-19 MED ORDER — ONDANSETRON 4 MG PO TBDP
4.0000 mg | ORAL_TABLET | Freq: Once | ORAL | Status: AC
  Administered 2019-07-19: 4 mg via ORAL

## 2019-07-19 MED ORDER — SODIUM CHLORIDE 0.9% FLUSH: 3.0000 mL | Freq: Once | INTRAVENOUS | Status: DC

## 2019-07-19 NOTE — ED Triage Notes (Signed)
Per [facility] pt reports he woke this am w/abd pain, vomit x1 en route, per EMS pt filled up an emesis bag.

## 2019-07-20 LAB — CBC
HCT: 44.5 % (ref 39.0–52.0)
Hemoglobin: 14.6 g/dL (ref 13.0–17.0)
MCH: 27.5 pg (ref 26.0–34.0)
MCHC: 32.8 g/dL (ref 30.0–36.0)
MCV: 84 fL (ref 80.0–100.0)
Platelets: 299 10*3/uL (ref 150–400)
RBC: 5.3 MIL/uL (ref 4.22–5.81)
RDW: 13.3 % (ref 11.5–15.5)
WBC: 9.6 10*3/uL (ref 4.0–10.5)
nRBC: 0 % (ref 0.0–0.2)

## 2019-07-20 LAB — COMPREHENSIVE METABOLIC PANEL
ALT: 23 U/L (ref 0–44)
AST: 23 U/L (ref 15–41)
Albumin: 3.8 g/dL (ref 3.5–5.0)
Alkaline Phosphatase: 59 U/L (ref 38–126)
Anion gap: 9 (ref 5–15)
BUN: 19 mg/dL (ref 6–20)
CO2: 25 mmol/L (ref 22–32)
Calcium: 8.6 mg/dL — ABNORMAL LOW (ref 8.9–10.3)
Chloride: 99 mmol/L (ref 98–111)
Creatinine, Ser: 1.27 mg/dL — ABNORMAL HIGH (ref 0.61–1.24)
GFR calc Af Amer: >60 mL/min (ref >60)
GFR calc non Af Amer: >60 mL/min (ref >60)
Glucose, Bld: 117 mg/dL — ABNORMAL HIGH (ref 70–99)
Potassium: 2.9 mmol/L — ABNORMAL LOW (ref 3.5–5.1)
Sodium: 133 mmol/L — ABNORMAL LOW (ref 135–145)
Total Bilirubin: 0.6 mg/dL (ref 0.3–1.2)
Total Protein: 7.2 g/dL (ref 6.5–8.1)

## 2019-07-20 LAB — LIPASE, BLOOD: Lipase: 66 U/L — ABNORMAL HIGH (ref 11–51)

## 2019-07-20 NOTE — ED Notes (Signed)
No answer to round on vitalsigns

## 2019-07-20 NOTE — ED Notes (Signed)
PT no answer for vitals, or registration. Will call one more time and move off the floor

## 2020-01-20 ENCOUNTER — Emergency Department (HOSPITAL_COMMUNITY): Payer: Self-pay

## 2020-01-20 ENCOUNTER — Encounter (HOSPITAL_COMMUNITY): Payer: Self-pay | Admitting: Emergency Medicine

## 2020-01-20 ENCOUNTER — Emergency Department (HOSPITAL_COMMUNITY)
Admission: EM | Admit: 2020-01-20 | Discharge: 2020-01-20 | Disposition: A | Discharge status: Home / Self Care | Payer: Self-pay | Attending: Emergency Medicine | Admitting: Emergency Medicine

## 2020-01-20 DIAGNOSIS — Z79899 Other long term (current) drug therapy: Secondary | ICD-10-CM | POA: Insufficient documentation

## 2020-01-20 DIAGNOSIS — E876 Hypokalemia: Secondary | ICD-10-CM | POA: Insufficient documentation

## 2020-01-20 DIAGNOSIS — Z87891 Personal history of nicotine dependence: Secondary | ICD-10-CM | POA: Insufficient documentation

## 2020-01-20 DIAGNOSIS — M6281 Muscle weakness (generalized): Secondary | ICD-10-CM | POA: Insufficient documentation

## 2020-01-20 DIAGNOSIS — R531 Weakness: Secondary | ICD-10-CM

## 2020-01-20 DIAGNOSIS — I10 Essential (primary) hypertension: Secondary | ICD-10-CM | POA: Insufficient documentation

## 2020-01-20 DIAGNOSIS — Z7982 Long term (current) use of aspirin: Secondary | ICD-10-CM | POA: Insufficient documentation

## 2020-01-20 LAB — URINALYSIS, ROUTINE W REFLEX MICROSCOPIC
Bilirubin Urine: NEGATIVE
Glucose, UA: NEGATIVE mg/dL
Hgb urine dipstick: NEGATIVE
Ketones, ur: NEGATIVE mg/dL
Leukocytes,Ua: NEGATIVE
Nitrite: NEGATIVE
Protein, ur: NEGATIVE mg/dL
Specific Gravity, Urine: 1.011 (ref 1.005–1.030)
pH: 5 (ref 5.0–8.0)

## 2020-01-20 LAB — BASIC METABOLIC PANEL
Anion gap: 10 (ref 5–15)
BUN: 12 mg/dL (ref 6–20)
CO2: 29 mmol/L (ref 22–32)
Calcium: 8.9 mg/dL (ref 8.9–10.3)
Chloride: 101 mmol/L (ref 98–111)
Creatinine, Ser: 1.02 mg/dL (ref 0.61–1.24)
GFR calc Af Amer: >60 mL/min (ref >60)
GFR calc non Af Amer: >60 mL/min (ref >60)
Glucose, Bld: 108 mg/dL — ABNORMAL HIGH (ref 70–99)
Potassium: 3 mmol/L — ABNORMAL LOW (ref 3.5–5.1)
Sodium: 140 mmol/L (ref 135–145)

## 2020-01-20 LAB — CBC
HCT: 45 % (ref 39.0–52.0)
Hemoglobin: 14.6 g/dL (ref 13.0–17.0)
MCH: 27.4 pg (ref 26.0–34.0)
MCHC: 32.4 g/dL (ref 30.0–36.0)
MCV: 84.4 fL (ref 80.0–100.0)
Platelets: 313 10*3/uL (ref 150–400)
RBC: 5.33 MIL/uL (ref 4.22–5.81)
RDW: 13.4 % (ref 11.5–15.5)
WBC: 7.5 10*3/uL (ref 4.0–10.5)
nRBC: 0 % (ref 0.0–0.2)

## 2020-01-20 LAB — MAGNESIUM: Magnesium: 2.2 mg/dL (ref 1.7–2.4)

## 2020-01-20 LAB — TROPONIN I (HIGH SENSITIVITY)
Troponin I (High Sensitivity): 6 ng/L (ref <18)
Troponin I (High Sensitivity): 7 ng/L (ref <18)

## 2020-01-20 MED ORDER — SODIUM CHLORIDE 0.9 % IV BOLUS
1000.0000 mL | Freq: Once | INTRAVENOUS | Status: AC
  Administered 2020-01-20: 21:00:00 1000 mL via INTRAVENOUS

## 2020-01-20 MED ORDER — POTASSIUM CHLORIDE CRYS ER 20 MEQ PO TBCR
40.0000 meq | EXTENDED_RELEASE_TABLET | Freq: Once | ORAL | Status: AC
  Administered 2020-01-20: 40 meq via ORAL
  Filled 2020-01-20: qty 2

## 2020-01-20 MED ORDER — SODIUM CHLORIDE 0.9% FLUSH: 3.0000 mL | Freq: Once | INTRAVENOUS | Status: DC

## 2020-01-20 NOTE — ED Provider Notes (Signed)
[facility] EMERGENCY DEPARTMENT Provider Note   CSN: [id number] Arrival date & time: 01/20/20  1616     History Chief Complaint  Patient presents with  . Generalized Body Aches    Dylan Burgess is a 59 y.o. male.  Patient presents the emergency department generalized weakness and headache.  Patient states that while he was at work yesterday he felt generally unwell and had some cramping and spasms of his left hand and wrist.  He works at an [employer] and does a lot of physical labor.  He thinks that he was working hard and not drinking enough water.  He was dismissed and went home.  Today he is just felt generally weak.  No focal weakness or stroke symptoms.  He denies any fevers, chest pain, shortness of breath or cough.  He has had both Covid vaccines.  No nausea, vomiting, or diarrhea.  No urinary symptoms.  No lower extremity swelling or skin rashes.  No treatments prior to arrival.  Patient presents to be evaluated for the symptoms.  Patient denies signs of stroke including: facial droop, slurred speech, aphasia, weakness/numbness in extremities, imbalance/trouble walking.         Past Medical History:  Diagnosis Date  . ADHD (attention deficit hyperactivity disorder)   . Anxiety   . Depression   . Hypertension     Patient Active Problem List   Diagnosis Date Noted  . Hypertension   . HEMOCCULT POSITIVE STOOL 02/08/2009  . MUSCLE CRAMPS 01/11/2009    History reviewed. No pertinent surgical history.     Family History  Problem Relation Age of Onset  . Kidney disease Mother        was on dialysis at time of death  . Diabetes Mother   . Hypertension Mother   . Hypertension Sister   . Kidney disease Brother        kidney failure on dialysis  . Hypertension Brother        Cause of kidney failure  . Obesity Daughter   . Diabetes Brother   . Hyperlipidemia Brother   . Hyperlipidemia Brother     Social History   Tobacco Use  .  Smoking status: Former Smoker    Years: 5.00    Types: Cigarettes    Quit date: 09/21/2002    Years since quitting: 17.3  . Smokeless tobacco: Never Used  Substance Use Topics  . Alcohol use: Not Currently  . Drug use: No    Home Medications Prior to Admission medications   Medication Sig Start Date End Date Taking? Authorizing Provider  aspirin EC 81 MG tablet Take 81 mg by mouth daily.    [provider]  ibuprofen (ADVIL,MOTRIN) 200 MG tablet 3 tabs twice daily for pain with meal Patient not taking: Reported on 03/15/2019 04/21/18   Julieanne Manson, MD  lisinopril-hydrochlorothiazide (ZESTORETIC) 20-25 MG tablet 1/2 tab by mouth in morning daily 03/15/19   Julieanne Manson, MD  metoprolol tartrate (LOPRESSOR) 25 MG tablet Take 1 tablet (25 mg total) by mouth 2 (two) times daily. Patient not taking: Reported on 03/15/2019 04/21/18   Julieanne Manson, MD  penicillin v potassium (VEETID) 250 MG tablet 1 tab by mouth 4 times daily for 7 days Patient not taking: Reported on 03/15/2019 04/21/18   Julieanne Manson, MD    Allergies    Patient has no known allergies.  Review of Systems   Review of Systems  Constitutional: Negative for fever.  HENT: Negative for  rhinorrhea and sore throat.   Eyes: Negative for redness.  Respiratory: Negative for cough.   Cardiovascular: Negative for chest pain.  Gastrointestinal: Negative for abdominal pain, diarrhea, nausea and vomiting.  Genitourinary: Negative for dysuria.  Musculoskeletal: Positive for myalgias.  Skin: Negative for rash.  Neurological: Positive for weakness and headaches.    Physical Exam Updated Vital Signs BP 111/80 (BP Location: Right Arm)   Pulse (!) 58   Temp 97.6 F (36.4 C) (Oral)   Resp 18   Ht 6' (1.829 m)   Wt 90.7 kg   SpO2 97%   BMI 27.12 kg/m   Physical Exam Vitals and nursing note reviewed.  Constitutional:      Appearance: He is well-developed.  HENT:     Head: Normocephalic and  atraumatic.     Right Ear: Tympanic membrane, ear canal and external ear normal.     Left Ear: Tympanic membrane, ear canal and external ear normal.     Nose: Nose normal.     Mouth/Throat:     Pharynx: Uvula midline.  Eyes:     General: Lids are normal.        Right eye: No discharge.        Left eye: No discharge.     Conjunctiva/sclera: Conjunctivae normal.     Pupils: Pupils are equal, round, and reactive to light.  Cardiovascular:     Rate and Rhythm: Normal rate and regular rhythm.     Heart sounds: Normal heart sounds.  Pulmonary:     Effort: Pulmonary effort is normal.     Breath sounds: Normal breath sounds.  Abdominal:     Palpations: Abdomen is soft.     Tenderness: There is no abdominal tenderness.  Musculoskeletal:        General: Normal range of motion.     Cervical back: Normal range of motion and neck supple. No tenderness or bony tenderness.  Skin:    General: Skin is warm and dry.  Neurological:     Mental Status: He is alert and oriented to person, place, and time.     GCS: GCS eye subscore is 4. GCS verbal subscore is 5. GCS motor subscore is 6.     Cranial Nerves: No cranial nerve deficit.     Sensory: No sensory deficit.     Motor: No abnormal muscle tone.     Coordination: Coordination normal.     ED Results / Procedures / Treatments   Labs (all labs ordered are listed, but only abnormal results are displayed) Labs Reviewed  BASIC METABOLIC PANEL - Abnormal; Notable for the following components:      Result Value   Potassium 3.0 (*)    Glucose, Bld 108 (*)    All other components within normal limits  CBC  URINALYSIS, ROUTINE W REFLEX MICROSCOPIC  MAGNESIUM  TROPONIN I (HIGH SENSITIVITY)  TROPONIN I (HIGH SENSITIVITY)    ED ECG REPORT   Date: 01/20/2020  Rate: 64  Rhythm: normal sinus rhythm  QRS Axis: normal  Intervals: normal  ST/T Wave abnormalities: normal  Conduction Disutrbances:none  Narrative Interpretation:   Old EKG  Reviewed: changes noted, PR normalized from 08/22/19  I have personally reviewed the EKG tracing and agree with the computerized printout as noted.  Radiology DG Chest 2 View  Result Date: 01/20/2020 CLINICAL DATA:  Weakness EXAM: CHEST - 2 VIEW COMPARISON:  Feb 12, 2018 FINDINGS: There is suggestion of a retrocardiac opacity. There is no pneumothorax. No large  pleural effusion. The heart size is stable. There is no acute osseous abnormality. IMPRESSION: Subtle retrocardiac opacity which may represent a developing infiltrate or atelectasis. Electronically Signed   By: Constance Holster M.D.   On: 01/20/2020 17:22    Procedures Procedures (including critical care time)  Medications Ordered in ED Medications  sodium chloride flush (NS) 0.9 % injection 3 mL (has no administration in time range)  potassium chloride SA (KLOR-CON) CR tablet 40 mEq (40 mEq Oral Given 01/20/20 2056)  sodium chloride 0.9 % bolus 1,000 mL (0 mLs Intravenous Stopped 01/20/20 2215)    ED Course  I have reviewed the triage vital signs and the nursing notes.  Pertinent labs & imaging results that were available during my care of the patient were reviewed by me and considered in my medical decision making (see chart for details).  Patient seen and examined.  EKG reviewed.  Work-up reviewed to this point.  Patient has a potassium of 3.0.  Normal troponin.  Chest x-ray with retrocardiac opacity, but patient without any symptoms of pneumonia, PE, Covid.  Low concern for infection at this point.  Vital signs reviewed and are as follows: BP 111/80 (BP Location: Right Arm)   Pulse (!) 58   Temp 97.6 F (36.4 C) (Oral)   Resp 18   Ht 6' (1.829 m)   Wt 90.7 kg   SpO2 97%   BMI 27.12 kg/m   Patient overall looks very well.  He does not have any focal neurologic symptoms.  I would like to give him a fluid bolus and check a magnesium.  He will be given oral potassium replacement in the ED.  Discussed with patient and wife.   If everything looks good, anticipate discharged home.  I have strongly encouraged him to follow-up with his PCP for routine physical.  He last saw his physician on 03/15/2019.  10:24 PM Mag is ok. 2nd trop stable.   10:40 PM Patient rechecked.  He states that he is feeling better.  He is able to rest during the upcoming weekend.  Encouraged good hydration as well.  Discussed return to the emergency department with any worsening or changing symptoms.  Encouraged PCP follow-up as discussed above.    MDM Rules/Calculators/A&P                      Patient presents emergency department after having an episode of carpopedal spasm and weakness yesterday and weakness today.  Labs are reassuring.  Mild hypokalemia, normal Mg++.  EKG normal. Troponin neg x 2. Feels better after IVF and oral potassium.   Final Clinical Impression(s) / ED Diagnoses Final diagnoses:  Generalized weakness  Hypokalemia    Rx / DC Orders ED Discharge Orders    None       Carlisle Cater, PA-C 01/20/20 2246    Maudie Flakes, MD 01/26/20 Benancio Deeds

## 2020-01-20 NOTE — Discharge Instructions (Addendum)
Please read and follow all provided instructions.  Your diagnoses today include:  1. Generalized weakness   2. Hypokalemia     Tests performed today include:  Blood counts and electrolytes - slightly low potassium, normal magnesium  Cardiac enzymes - no sign of stress on heart  Chest x-ray  Vital signs. See below for your results today.   Medications prescribed:   None  Take any prescribed medications only as directed.  Home care instructions:  Follow any educational materials contained in this packet.  Follow-up instructions: Please follow-up with your primary care provider in the next 5 days for further evaluation of your symptoms.   Return instructions:   Please return to the Emergency Department if you experience worsening symptoms.   Please return if you have any other emergent concerns.  Additional Information:  Your vital signs today were: BP 129/84   Pulse 64   Temp 97.6 F (36.4 C) (Oral)   Resp 18   Ht 6' (1.829 m)   Wt 90.7 kg   SpO2 100%   BMI 27.12 kg/m  If your blood pressure (BP) was elevated above 135/85 this visit, please have this repeated by your doctor within one month. --------------

## 2020-01-20 NOTE — ED Triage Notes (Signed)
Pt here with c/o weakness and dizziness yesterday ayt work , pt also c/o h/a , pt has had both covid shots

## 2020-03-10 ENCOUNTER — Emergency Department (HOSPITAL_COMMUNITY)
Admission: EM | Admit: 2020-03-10 | Discharge: 2020-03-11 | Disposition: A | Discharge status: Home / Self Care | Payer: No Typology Code available for payment source | Attending: Emergency Medicine | Admitting: Emergency Medicine

## 2020-03-10 ENCOUNTER — Other Ambulatory Visit: Payer: Self-pay

## 2020-03-10 ENCOUNTER — Encounter (HOSPITAL_COMMUNITY): Payer: Self-pay | Admitting: *Deleted

## 2020-03-10 DIAGNOSIS — R1013 Epigastric pain: Secondary | ICD-10-CM | POA: Diagnosis present

## 2020-03-10 DIAGNOSIS — Z87891 Personal history of nicotine dependence: Secondary | ICD-10-CM | POA: Diagnosis not present

## 2020-03-10 DIAGNOSIS — Z79899 Other long term (current) drug therapy: Secondary | ICD-10-CM | POA: Diagnosis not present

## 2020-03-10 DIAGNOSIS — I10 Essential (primary) hypertension: Secondary | ICD-10-CM | POA: Diagnosis not present

## 2020-03-10 MED ORDER — SODIUM CHLORIDE 0.9% FLUSH
3.0000 mL | Freq: Once | INTRAVENOUS | Status: AC
  Administered 2020-03-11: 3 mL via INTRAVENOUS

## 2020-03-10 MED ORDER — ONDANSETRON 4 MG PO TBDP
8.0000 mg | ORAL_TABLET | Freq: Once | ORAL | Status: AC
  Administered 2020-03-10: 8 mg via ORAL
  Filled 2020-03-10: qty 2

## 2020-03-10 NOTE — ED Triage Notes (Signed)
Sudden onset  30 minutes ago at home nauseated with vomiting and indigestion   He has vomited on his clothes

## 2020-03-11 LAB — URINALYSIS, ROUTINE W REFLEX MICROSCOPIC
Bilirubin Urine: NEGATIVE
Glucose, UA: NEGATIVE mg/dL
Hgb urine dipstick: NEGATIVE
Ketones, ur: NEGATIVE mg/dL
Leukocytes,Ua: NEGATIVE
Nitrite: NEGATIVE
Protein, ur: NEGATIVE mg/dL
Specific Gravity, Urine: 1.018 (ref 1.005–1.030)
pH: 7 (ref 5.0–8.0)

## 2020-03-11 LAB — COMPREHENSIVE METABOLIC PANEL
ALT: 22 U/L (ref 0–44)
AST: 23 U/L (ref 15–41)
Albumin: 3.9 g/dL (ref 3.5–5.0)
Alkaline Phosphatase: 61 U/L (ref 38–126)
Anion gap: 11 (ref 5–15)
BUN: 15 mg/dL (ref 6–20)
CO2: 25 mmol/L (ref 22–32)
Calcium: 8.9 mg/dL (ref 8.9–10.3)
Chloride: 101 mmol/L (ref 98–111)
Creatinine, Ser: 1.28 mg/dL — ABNORMAL HIGH (ref 0.61–1.24)
GFR calc Af Amer: >60 mL/min (ref >60)
GFR calc non Af Amer: >60 mL/min (ref >60)
Glucose, Bld: 128 mg/dL — ABNORMAL HIGH (ref 70–99)
Potassium: 2.9 mmol/L — ABNORMAL LOW (ref 3.5–5.1)
Sodium: 137 mmol/L (ref 135–145)
Total Bilirubin: 0.5 mg/dL (ref 0.3–1.2)
Total Protein: 7.6 g/dL (ref 6.5–8.1)

## 2020-03-11 LAB — CBC
HCT: 45.8 % (ref 39.0–52.0)
Hemoglobin: 15.1 g/dL (ref 13.0–17.0)
MCH: 27.6 pg (ref 26.0–34.0)
MCHC: 33 g/dL (ref 30.0–36.0)
MCV: 83.7 fL (ref 80.0–100.0)
Platelets: 336 10*3/uL (ref 150–400)
RBC: 5.47 MIL/uL (ref 4.22–5.81)
RDW: 13.2 % (ref 11.5–15.5)
WBC: 11.8 10*3/uL — ABNORMAL HIGH (ref 4.0–10.5)
nRBC: 0 % (ref 0.0–0.2)

## 2020-03-11 LAB — TROPONIN I (HIGH SENSITIVITY)
Troponin I (High Sensitivity): 6 ng/L (ref <18)
Troponin I (High Sensitivity): 5 ng/L (ref <18)

## 2020-03-11 LAB — MAGNESIUM: Magnesium: 2.1 mg/dL (ref 1.7–2.4)

## 2020-03-11 LAB — LIPASE, BLOOD: Lipase: 45 U/L (ref 11–51)

## 2020-03-11 LAB — CBG MONITORING, ED: Glucose-Capillary: 115 mg/dL — ABNORMAL HIGH (ref 70–99)

## 2020-03-11 MED ORDER — PANTOPRAZOLE SODIUM 20 MG PO TBEC
20.0000 mg | DELAYED_RELEASE_TABLET | Freq: Every day | ORAL | 0 refills | Status: DC
Start: 2020-03-11 — End: 2022-12-24

## 2020-03-11 MED ORDER — LACTATED RINGERS IV BOLUS
1000.0000 mL | Freq: Once | INTRAVENOUS | Status: AC
  Administered 2020-03-11: 1000 mL via INTRAVENOUS

## 2020-03-11 MED ORDER — ALUM & MAG HYDROXIDE-SIMETH 400-400-40 MG/5ML PO SUSP: 10.0000 mL | Freq: Four times a day (QID) | ORAL | 0 refills | Status: DC | PRN

## 2020-03-11 MED ORDER — ONDANSETRON HCL 4 MG PO TABS
4.0000 mg | ORAL_TABLET | Freq: Three times a day (TID) | ORAL | 0 refills | Status: DC | PRN
Start: 2020-03-11 — End: 2021-11-23

## 2020-03-11 MED ORDER — POTASSIUM CHLORIDE 10 MEQ/100ML IV SOLN
10.0000 meq | Freq: Once | INTRAVENOUS | Status: AC
  Administered 2020-03-11: 10 meq via INTRAVENOUS
  Filled 2020-03-11: qty 100

## 2020-03-11 MED ORDER — MAGNESIUM SULFATE 2 GM/50ML IV SOLN
2.0000 g | Freq: Once | INTRAVENOUS | Status: AC
  Administered 2020-03-11: 2 g via INTRAVENOUS
  Filled 2020-03-11: qty 50

## 2020-03-11 MED ORDER — POTASSIUM CHLORIDE CRYS ER 20 MEQ PO TBCR
40.0000 meq | EXTENDED_RELEASE_TABLET | Freq: Once | ORAL | Status: AC
  Administered 2020-03-11: 40 meq via ORAL
  Filled 2020-03-11: qty 2

## 2020-03-11 NOTE — ED Notes (Signed)
Discharge instructions reviewed with pt. Pt verbalized understanding.   

## 2020-03-13 NOTE — ED Provider Notes (Signed)
[facility] EMERGENCY DEPARTMENT Provider Note   CSN: [id number] Arrival date & time: 03/10/20  2305     History Chief Complaint  Patient presents with  . Abdominal Pain  . Chest Pain    Dylan Burgess is a 59 y.o. male.  States his pains are after vomiting. Abdomen and chest. Improving now.    Abdominal Pain Pain location:  Epigastric Pain severity:  Mild Timing:  Constant Progression:  Improving Context: not alcohol use and not retching   Relieved by:  None tried Worsened by:  Nothing Ineffective treatments:  None tried Associated symptoms: chest pain   Chest Pain Associated symptoms: abdominal pain        Past Medical History:  Diagnosis Date  . ADHD (attention deficit hyperactivity disorder)   . Anxiety   . Depression   . Hypertension     Patient Active Problem List   Diagnosis Date Noted  . Hypertension   . HEMOCCULT POSITIVE STOOL 02/08/2009  . MUSCLE CRAMPS 01/11/2009    History reviewed. No pertinent surgical history.     Family History  Problem Relation Age of Onset  . Kidney disease Mother        was on dialysis at time of death  . Diabetes Mother   . Hypertension Mother   . Hypertension Sister   . Kidney disease Brother        kidney failure on dialysis  . Hypertension Brother        Cause of kidney failure  . Obesity Daughter   . Diabetes Brother   . Hyperlipidemia Brother   . Hyperlipidemia Brother     Social History   Tobacco Use  . Smoking status: Former Smoker    Years: 5.00    Types: Cigarettes    Quit date: 09/21/2002    Years since quitting: 17.4  . Smokeless tobacco: Never Used  Vaping Use  . Vaping Use: Never used  Substance Use Topics  . Alcohol use: Not Currently  . Drug use: No    Home Medications Prior to Admission medications   Medication Sig Start Date End Date Taking? Authorizing Provider  aspirin EC 81 MG tablet Take 81 mg by mouth daily.   Yes [provider]    lisinopril-hydrochlorothiazide (ZESTORETIC) 20-25 MG tablet 1/2 tab by mouth in morning daily Patient taking differently: Take 0.5 tablets by mouth daily.  03/15/19  Yes Julieanne Manson, MD  alum & mag hydroxide-simeth (MAALOX MAX) 400-400-40 MG/5ML suspension Take 10 mLs by mouth every 6 (six) hours as needed for indigestion. 03/11/20   Kaylob Wallen, Barbara Cower, MD  ondansetron (ZOFRAN) 4 MG tablet Take 1 tablet (4 mg total) by mouth every 8 (eight) hours as needed for nausea or vomiting. 03/11/20   Shelisha Gautier, Barbara Cower, MD  pantoprazole (PROTONIX) 20 MG tablet Take 1 tablet (20 mg total) by mouth daily. 03/11/20   Mykiah Schmuck, Barbara Cower, MD  metoprolol tartrate (LOPRESSOR) 25 MG tablet Take 1 tablet (25 mg total) by mouth 2 (two) times daily. Patient not taking: Reported on 03/15/2019 04/21/18 01/20/20  Julieanne Manson, MD    Allergies    Patient has no known allergies.  Review of Systems   Review of Systems  Cardiovascular: Positive for chest pain.  Gastrointestinal: Positive for abdominal pain.  All other systems reviewed and are negative.   Physical Exam Updated Vital Signs BP 120/77   Pulse 62   Temp 98.1 F (36.7 C) (Axillary)   Resp 12   Ht 6' (1.829 m)  Wt 90.7 kg   SpO2 100%   BMI 27.12 kg/m   Physical Exam Vitals and nursing note reviewed.  Constitutional:      Appearance: He is well-developed.  HENT:     Head: Normocephalic and atraumatic.  Cardiovascular:     Rate and Rhythm: Normal rate.  Pulmonary:     Effort: Pulmonary effort is normal. No respiratory distress.  Abdominal:     General: There is no distension.     Palpations: Abdomen is soft.     Tenderness: There is no abdominal tenderness.  Musculoskeletal:        General: Normal range of motion.     Cervical back: Normal range of motion.  Skin:    General: Skin is warm and dry.  Neurological:     Mental Status: He is alert.     ED Results / Procedures / Treatments   Labs (all labs ordered are listed, but only  abnormal results are displayed) Labs Reviewed  COMPREHENSIVE METABOLIC PANEL - Abnormal; Notable for the following components:      Result Value   Potassium 2.9 (*)    Glucose, Bld 128 (*)    Creatinine, Ser 1.28 (*)    All other components within normal limits  CBC - Abnormal; Notable for the following components:   WBC 11.8 (*)    All other components within normal limits  CBG MONITORING, ED - Abnormal; Notable for the following components:   Glucose-Capillary 115 (*)    All other components within normal limits  LIPASE, BLOOD  URINALYSIS, ROUTINE W REFLEX MICROSCOPIC  MAGNESIUM  TROPONIN I (HIGH SENSITIVITY)  TROPONIN I (HIGH SENSITIVITY)    EKG EKG Interpretation  Date/Time:  Sunday March 11 2020 01:59:35 EDT Ventricular Rate:  63 PR Interval:    QRS Duration: 119 QT Interval:  407 QTC Calculation: 417 R Axis:   -15 Text Interpretation: Sinus rhythm Prolonged PR interval Nonspecific intraventricular conduction delay Minimal ST elevation, inferior leads No acute changes No significant change since last tracing Confirmed by Derwood Kaplan [phone]) on 03/12/2020 5:36:48 PM   Radiology No results found.  Procedures Procedures (including critical care time)  Medications Ordered in ED Medications  sodium chloride flush (NS) 0.9 % injection 3 mL (3 mLs Intravenous Given 03/11/20 0143)  ondansetron (ZOFRAN-ODT) disintegrating tablet 8 mg (8 mg Oral Given 03/10/20 2338)  magnesium sulfate IVPB 2 g 50 mL (0 g Intravenous Stopped 03/11/20 0430)  potassium chloride SA (KLOR-CON) CR tablet 40 mEq (40 mEq Oral Given 03/11/20 0155)  potassium chloride 10 mEq in 100 mL IVPB (0 mEq Intravenous Stopped 03/11/20 0302)  lactated ringers bolus 1,000 mL (0 mLs Intravenous Stopped 03/11/20 0430)    ED Course  I have reviewed the triage vital signs and the nursing notes.  Pertinent labs & imaging results that were available during my care of the patient were reviewed by me and considered in  my medical decision making (see chart for details).    MDM Rules/Calculators/A&P                          Likely indigestion. troponins negative. Abdomen benign. No e/o abnormalities on workup here. Appears stable for discharge.   Final Clinical Impression(s) / ED Diagnoses Final diagnoses:  Epigastric pain    Rx / DC Orders ED Discharge Orders         Ordered    pantoprazole (PROTONIX) 20 MG tablet  Daily  Discontinue  Reprint     03/11/20 0541    alum & mag hydroxide-simeth (MAALOX MAX) 400-400-40 MG/5ML suspension  Every 6 hours PRN     Discontinue  Reprint     03/11/20 0541    ondansetron (ZOFRAN) 4 MG tablet  Every 8 hours PRN     Discontinue  Reprint     03/11/20 0541           Lougenia Morrissey, Barbara Cower, MD 03/13/20 0745

## 2020-03-17 ENCOUNTER — Encounter: Payer: Self-pay | Admitting: Internal Medicine

## 2020-08-06 ENCOUNTER — Encounter (HOSPITAL_COMMUNITY): Payer: Self-pay | Admitting: Emergency Medicine

## 2020-08-06 ENCOUNTER — Emergency Department (HOSPITAL_COMMUNITY)
Admission: EM | Admit: 2020-08-06 | Discharge: 2020-08-06 | Disposition: A | Discharge status: Home / Self Care | Payer: BC Managed Care – PPO | Attending: Emergency Medicine | Admitting: Emergency Medicine

## 2020-08-06 ENCOUNTER — Other Ambulatory Visit: Payer: Self-pay

## 2020-08-06 DIAGNOSIS — F419 Anxiety disorder, unspecified: Secondary | ICD-10-CM | POA: Diagnosis not present

## 2020-08-06 DIAGNOSIS — Z87891 Personal history of nicotine dependence: Secondary | ICD-10-CM | POA: Insufficient documentation

## 2020-08-06 DIAGNOSIS — I10 Essential (primary) hypertension: Secondary | ICD-10-CM | POA: Insufficient documentation

## 2020-08-06 DIAGNOSIS — F329 Major depressive disorder, single episode, unspecified: Secondary | ICD-10-CM | POA: Insufficient documentation

## 2020-08-06 DIAGNOSIS — F410 Panic disorder [episodic paroxysmal anxiety] without agoraphobia: Secondary | ICD-10-CM

## 2020-08-06 LAB — CBG MONITORING, ED: Glucose-Capillary: 123 mg/dL — ABNORMAL HIGH (ref 70–99)

## 2020-08-06 NOTE — ED Triage Notes (Signed)
Pt arriving POV with concern for anxiety attack. Pt reports symptoms were increased heart rate, dry mouth, and feeling disoriented. Pt A&O x4 upon arrival.

## 2020-08-06 NOTE — ED Provider Notes (Signed)
[facility]-EMERGENCY DEPT [facility] Emergency Department Provider Note MRN:  [id number]  Arrival date & time: 08/06/20     Chief Complaint   Anxiety   History of Present Illness   Dylan Burgess is a 59 y.o. year-old male with a history of anxiety, depression, hypertension presenting to the ED with chief complaint of anxiety.  Patient was at work having some issues with a particular load, began experiencing dry mouth, felt like his nerves were out of control, felt some tingling in bilateral hands, began to feel even more anxious when his coworkers tried to tell him that he was having a seizure or possibly had Covid.  Here for evaluation.  Denies any chest pain or shortness of breath, no headache or vision change, no numbness or weakness to the arms or legs.  Review of Systems  A complete 10 system review of systems was obtained and all systems are negative except as noted in the HPI and PMH.   Patient's Health History    Past Medical History:  Diagnosis Date  . ADHD (attention deficit hyperactivity disorder)   . Anxiety   . Depression   . Hypertension     History reviewed. No pertinent surgical history.  Family History  Problem Relation Age of Onset  . Kidney disease Mother        was on dialysis at time of death  . Diabetes Mother   . Hypertension Mother   . Hypertension Sister   . Kidney disease Brother        kidney failure on dialysis  . Hypertension Brother        Cause of kidney failure  . Obesity Daughter   . Diabetes Brother   . Hyperlipidemia Brother   . Hyperlipidemia Brother     Social History   Socioeconomic History  . Marital status: Legally Separated    Spouse name: Not on file  . Number of children: 5  . Years of education: 13  . Highest education level: Some college, no degree  Occupational History  . Occupation: truck driver--short distance  Tobacco Use  . Smoking status: Former Smoker    Years: 5.00    Types: Cigarettes    Quit date:  09/21/2002    Years since quitting: 17.8  . Smokeless tobacco: Never Used  Vaping Use  . Vaping Use: Never used  Substance and Sexual Activity  . Alcohol use: Not Currently  . Drug use: No  . Sexual activity: Not on file  Other Topics Concern  . Not on file  Social History Narrative   Originally from [place].   Difficult to get history   Has lived in [place] since 2007.   Lived in [place] since 1990   Children live in [place] or farther away   He currently lives with his girlfriend and 3 dogs.   Social Determinants of Health   Financial Resource Strain:   . Difficulty of Paying Living Expenses: Not on file  Food Insecurity:   . Worried About Programme researcher, broadcasting/film/video in the Last Year: Not on file  . Ran Out of Food in the Last Year: Not on file  Transportation Needs:   . Lack of Transportation (Medical): Not on file  . Lack of Transportation (Non-Medical): Not on file  Physical Activity:   . Days of Exercise per Week: Not on file  . Minutes of Exercise per Session: Not on file  Stress:   . Feeling of Stress : Not on file  Social Connections:   .  Frequency of Communication with Friends and Family: Not on file  . Frequency of Social Gatherings with Friends and Family: Not on file  . Attends Religious Services: Not on file  . Active Member of Clubs or Organizations: Not on file  . Attends Banker Meetings: Not on file  . Marital Status: Not on file  Intimate Partner Violence:   . Fear of Current or Ex-Partner: Not on file  . Emotionally Abused: Not on file  . Physically Abused: Not on file  . Sexually Abused: Not on file     Physical Exam   Vitals:   08/06/20 1952 08/06/20 2154  BP: (!) 140/95 137/82  Pulse: 79 62  Resp: 16 15  Temp: 98.2 F (36.8 C)   SpO2: 95% 97%    CONSTITUTIONAL: Well-appearing, NAD NEURO:  Alert and oriented x 3, no focal deficits EYES:  eyes equal and reactive ENT/NECK:  no LAD, no JVD CARDIO: Regular rate, well-perfused,  normal S1 and S2 PULM:  CTAB no wheezing or rhonchi GI/GU:  normal bowel sounds, non-distended, non-tender MSK/SPINE:  No gross deformities, no edema SKIN:  no rash, atraumatic PSYCH:  Appropriate speech and behavior  *Additional and/or pertinent findings included in MDM below  Diagnostic and Interventional Summary    EKG Interpretation  Date/Time:  Monday August 06 2020 21:53:17 EST Ventricular Rate:  62 PR Interval:    QRS Duration: 94 QT Interval:  421 QTC Calculation: 428 R Axis:   -30 Text Interpretation: Sinus rhythm Borderline prolonged PR interval Left axis deviation 12 Lead; Mason-Likar No significant change since last tracing Confirmed by Linwood Dibbles [phone]) on 08/06/2020 9:59:56 PM      Labs Reviewed  CBG MONITORING, ED - Abnormal; Notable for the following components:      Result Value   Glucose-Capillary 123 (*)    All other components within normal limits    No orders to display    Medications - No data to display   Procedures  /  Critical Care Procedures  ED Course and Medical Decision Making  I have reviewed the triage vital signs, the nursing notes, and pertinent available records from the EMR.  Listed above are laboratory and imaging tests that I personally ordered, reviewed, and interpreted and then considered in my medical decision making (see below for details).  Well-appearing, normal vital signs, history of panic or anxiety attacks here with similar symptoms.  Never had any chest pain or shortness of breath, EKG is reassuring, bilateral breath sounds, no evidence of DVT, normal neurological exam, appropriate for discharge.       Elmer Sow. Pilar Plate, MD [facility] Emergency Medicine [facility] [email or web address]  Final Clinical Impressions(s) / ED Diagnoses     ICD-10-CM   1. Anxiety attack  F41.0     ED Discharge Orders    None       Discharge Instructions Discussed with and Provided to Patient:     Discharge  Instructions     You were evaluated in the Emergency Department and after careful evaluation, we did not find any emergent condition requiring admission or further testing in the hospital.  Your exam/testing today was overall reassuring.  EKG was normal.  Please return to the Emergency Department if you experience any worsening of your condition.  Thank you for allowing [place] to be a part of your care.       Sabas Sous, MD 08/06/20 2217

## 2020-08-06 NOTE — Discharge Instructions (Addendum)
You were evaluated in the Emergency Department and after careful evaluation, we did not find any emergent condition requiring admission or further testing in the hospital.  Your exam/testing today was overall reassuring.  EKG was normal.  Please return to the Emergency Department if you experience any worsening of your condition.  Thank you for allowing [place] to be a part of your care.

## 2020-08-14 ENCOUNTER — Encounter (HOSPITAL_COMMUNITY): Payer: Self-pay | Admitting: Emergency Medicine

## 2020-08-14 ENCOUNTER — Other Ambulatory Visit: Payer: Self-pay

## 2020-08-14 ENCOUNTER — Emergency Department (HOSPITAL_COMMUNITY): Payer: BC Managed Care – PPO

## 2020-08-14 ENCOUNTER — Emergency Department (HOSPITAL_COMMUNITY)
Admission: EM | Admit: 2020-08-14 | Discharge: 2020-08-14 | Disposition: A | Discharge status: Home / Self Care | Payer: BC Managed Care – PPO | Attending: Emergency Medicine | Admitting: Emergency Medicine

## 2020-08-14 DIAGNOSIS — Z5321 Procedure and treatment not carried out due to patient leaving prior to being seen by health care provider: Secondary | ICD-10-CM | POA: Insufficient documentation

## 2020-08-14 DIAGNOSIS — R079 Chest pain, unspecified: Secondary | ICD-10-CM | POA: Insufficient documentation

## 2020-08-14 LAB — BASIC METABOLIC PANEL
Anion gap: 6 (ref 5–15)
BUN: 15 mg/dL (ref 6–20)
CO2: 28 mmol/L (ref 22–32)
Calcium: 8.7 mg/dL — ABNORMAL LOW (ref 8.9–10.3)
Chloride: 101 mmol/L (ref 98–111)
Creatinine, Ser: 1.08 mg/dL (ref 0.61–1.24)
GFR, Estimated: >60 mL/min (ref >60)
Glucose, Bld: 113 mg/dL — ABNORMAL HIGH (ref 70–99)
Potassium: 3.1 mmol/L — ABNORMAL LOW (ref 3.5–5.1)
Sodium: 135 mmol/L (ref 135–145)

## 2020-08-14 LAB — CBC
HCT: 47.8 % (ref 39.0–52.0)
Hemoglobin: 15.3 g/dL (ref 13.0–17.0)
MCH: 26.9 pg (ref 26.0–34.0)
MCHC: 32 g/dL (ref 30.0–36.0)
MCV: 84.2 fL (ref 80.0–100.0)
Platelets: 356 10*3/uL (ref 150–400)
RBC: 5.68 MIL/uL (ref 4.22–5.81)
RDW: 13.5 % (ref 11.5–15.5)
WBC: 7.9 10*3/uL (ref 4.0–10.5)
nRBC: 0 % (ref 0.0–0.2)

## 2020-08-14 LAB — TROPONIN I (HIGH SENSITIVITY): Troponin I (High Sensitivity): 6 ng/L (ref <18)

## 2020-08-14 NOTE — ED Triage Notes (Signed)
Patient here with chest pain.  Patient states that he took a half of Flexeril last night for sleep, didn't sleep much during the night and took another half this morning.  Patient now has chest pain.  No shortness of breath, no nausea, no vomiting.

## 2020-08-14 NOTE — ED Notes (Signed)
No answer when called for room x 3 

## 2020-08-27 ENCOUNTER — Encounter: Payer: Self-pay | Admitting: Internal Medicine

## 2020-10-31 ENCOUNTER — Telehealth: Payer: Self-pay

## 2020-10-31 NOTE — Telephone Encounter (Signed)
Copied from CRM [id number]. Topic: Appointment Scheduling - Scheduling Inquiry for Clinic >> Oct 31, 2020 12:27 PM Crist Infante wrote: Reason for CRM: pt needs appt for OC.  He is aware he will get call back to schedule appt   Called patient and LVM advising him that we would not be able to schedule an appointment for the orange card without having seen him as a patient before. If patient would like to apply through [facility] through [facility] he must complete a new patient appointment with one of our providers at [facility], [facility], or [facility].

## 2021-05-16 IMAGING — CR DG CHEST 2V
2 series · 2 of 2 positions shown · non-contrast
Comparison: February 12, 2018

CLINICAL DATA: Weakness

EXAM:
CHEST - 2 VIEW

[chest pa]
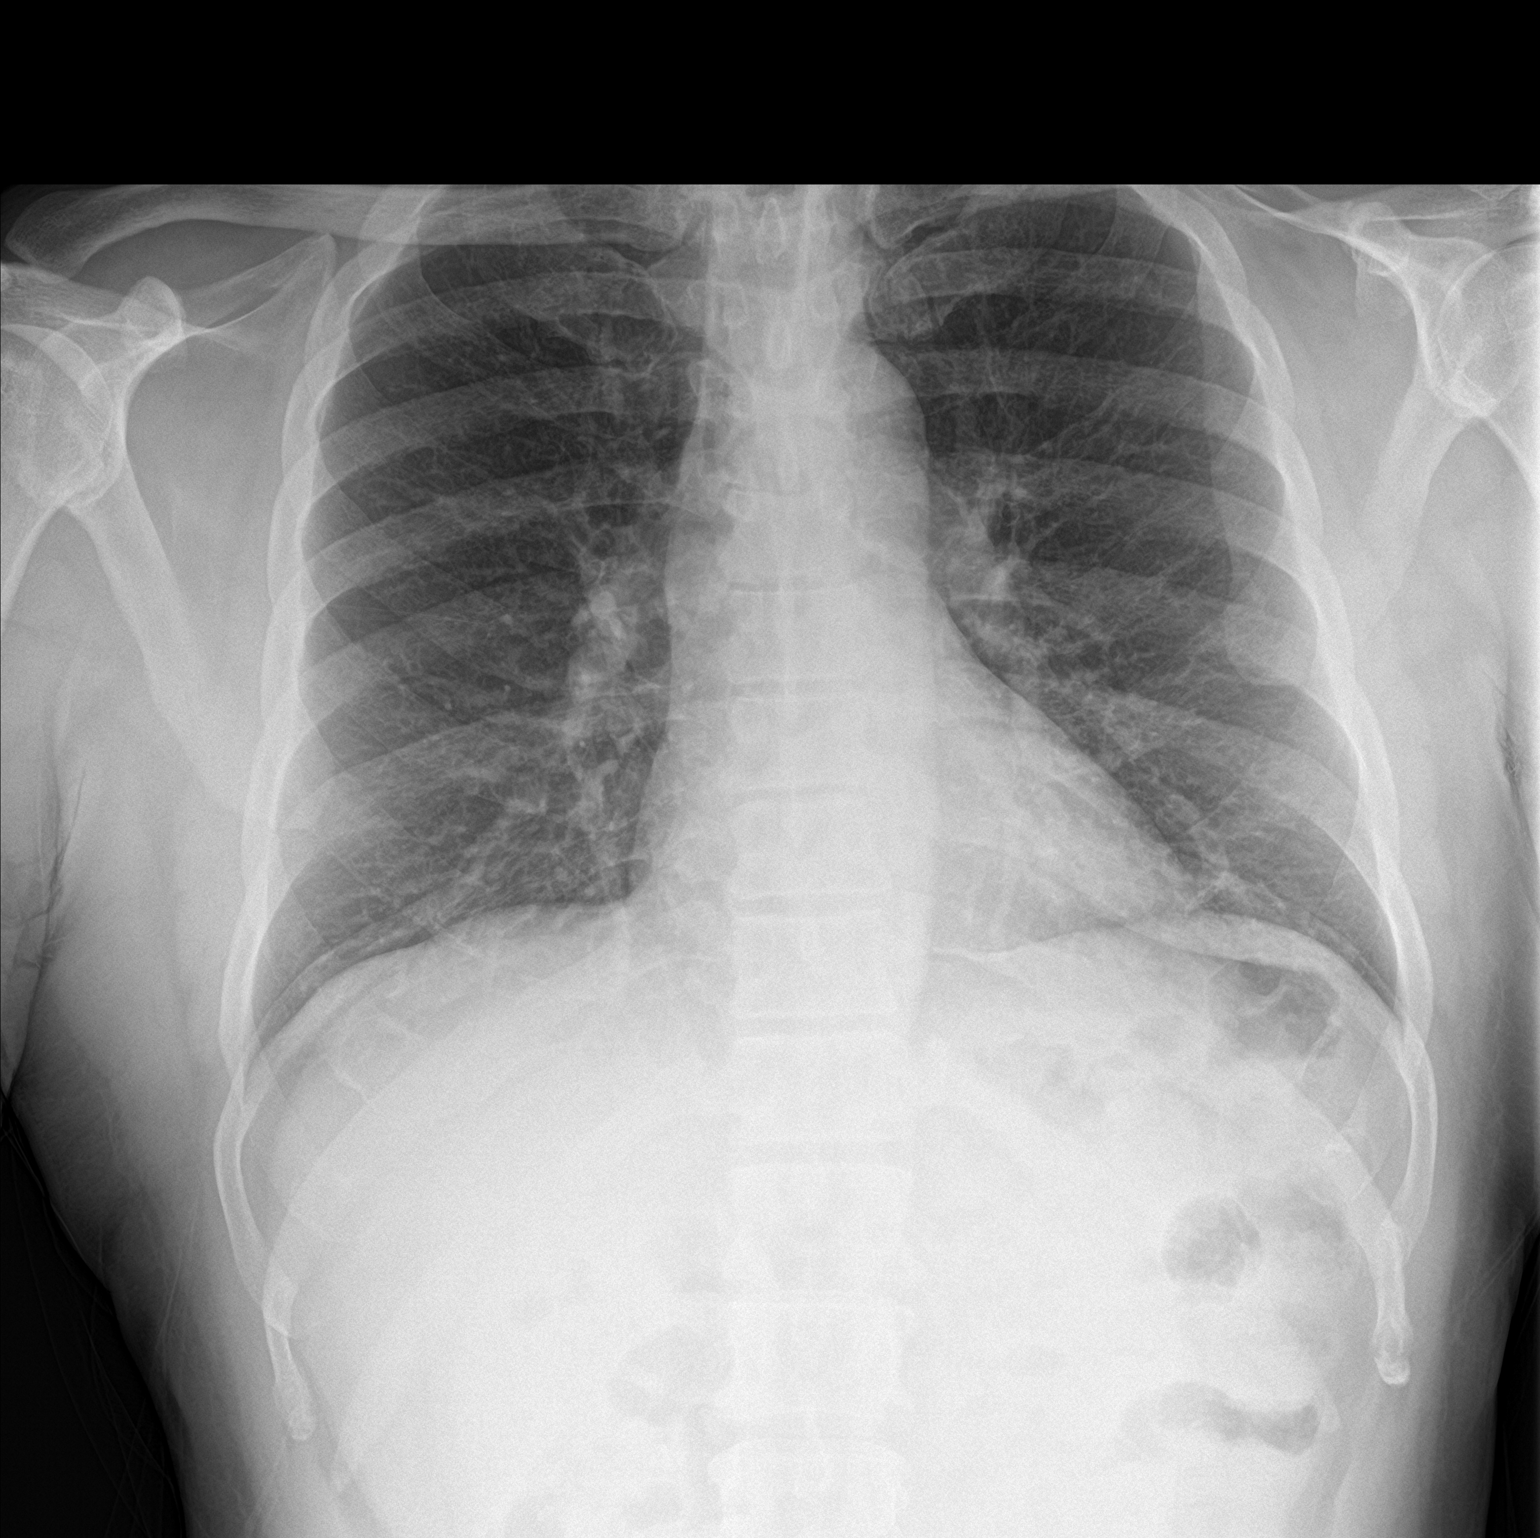

[chest lat]
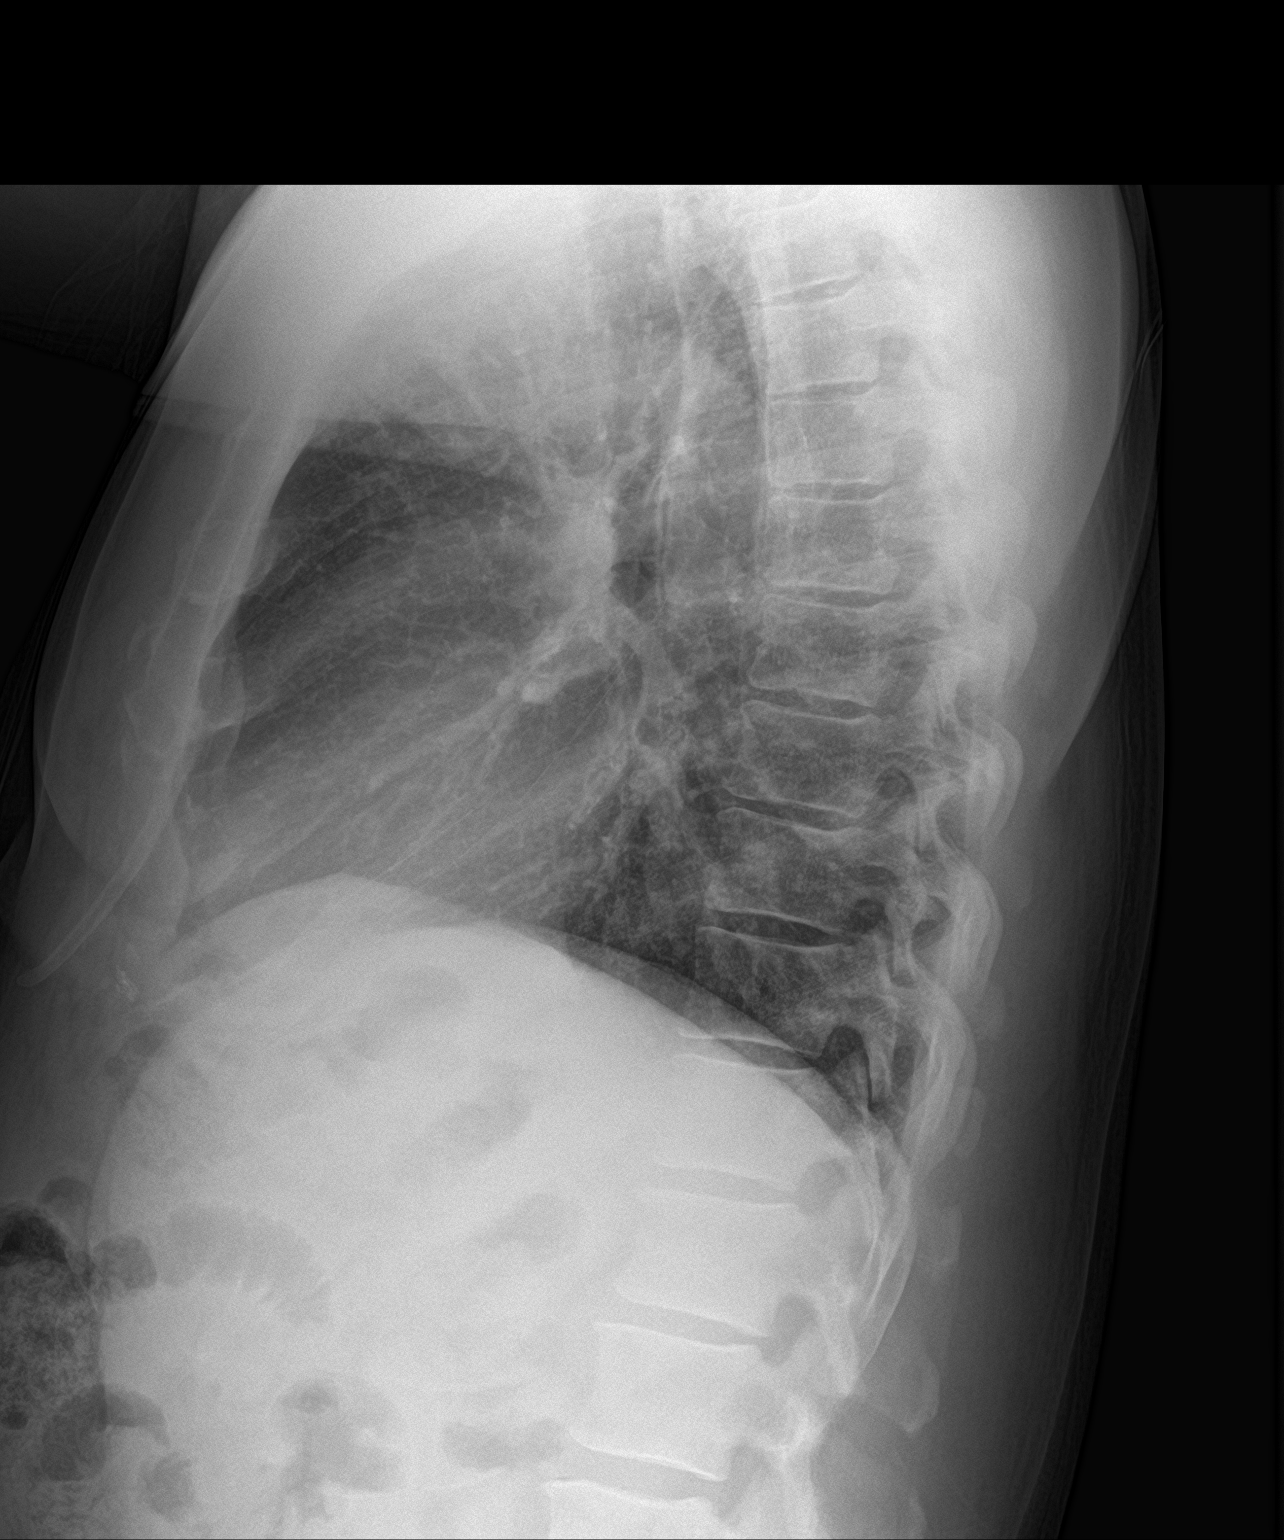

[2 of 2 positions shown; findings below may reference images not displayed]

FINDINGS: There is suggestion of a retrocardiac opacity. There is no
pneumothorax. No large pleural effusion. The heart size is stable.
There is no acute osseous abnormality.
IMPRESSION: Subtle retrocardiac opacity which may represent a developing
infiltrate or atelectasis.

## 2021-11-23 ENCOUNTER — Other Ambulatory Visit: Payer: Self-pay

## 2021-11-23 ENCOUNTER — Encounter (HOSPITAL_COMMUNITY): Payer: Self-pay | Admitting: Emergency Medicine

## 2021-11-23 ENCOUNTER — Emergency Department (HOSPITAL_COMMUNITY): Payer: Self-pay

## 2021-11-23 ENCOUNTER — Emergency Department (HOSPITAL_COMMUNITY)
Admission: EM | Admit: 2021-11-23 | Discharge: 2021-11-23 | Disposition: A | Discharge status: Home / Self Care | Payer: Self-pay | Attending: Emergency Medicine | Admitting: Emergency Medicine

## 2021-11-23 DIAGNOSIS — R109 Unspecified abdominal pain: Secondary | ICD-10-CM | POA: Insufficient documentation

## 2021-11-23 DIAGNOSIS — J189 Pneumonia, unspecified organism: Secondary | ICD-10-CM | POA: Insufficient documentation

## 2021-11-23 DIAGNOSIS — Z20822 Contact with and (suspected) exposure to covid-19: Secondary | ICD-10-CM | POA: Insufficient documentation

## 2021-11-23 DIAGNOSIS — I10 Essential (primary) hypertension: Secondary | ICD-10-CM | POA: Insufficient documentation

## 2021-11-23 DIAGNOSIS — D72829 Elevated white blood cell count, unspecified: Secondary | ICD-10-CM | POA: Insufficient documentation

## 2021-11-23 DIAGNOSIS — Z79899 Other long term (current) drug therapy: Secondary | ICD-10-CM | POA: Insufficient documentation

## 2021-11-23 DIAGNOSIS — Z7982 Long term (current) use of aspirin: Secondary | ICD-10-CM | POA: Insufficient documentation

## 2021-11-23 DIAGNOSIS — R000 Tachycardia, unspecified: Secondary | ICD-10-CM | POA: Insufficient documentation

## 2021-11-23 LAB — COMPREHENSIVE METABOLIC PANEL
ALT: 34 U/L (ref 0–44)
AST: 29 U/L (ref 15–41)
Albumin: 3.7 g/dL (ref 3.5–5.0)
Alkaline Phosphatase: 67 U/L (ref 38–126)
Anion gap: 10 (ref 5–15)
BUN: 15 mg/dL (ref 6–20)
CO2: 22 mmol/L (ref 22–32)
Calcium: 8.8 mg/dL — ABNORMAL LOW (ref 8.9–10.3)
Chloride: 103 mmol/L (ref 98–111)
Creatinine, Ser: 1.03 mg/dL (ref 0.61–1.24)
GFR, Estimated: >60 mL/min (ref >60)
Glucose, Bld: 180 mg/dL — ABNORMAL HIGH (ref 70–99)
Potassium: 3.8 mmol/L (ref 3.5–5.1)
Sodium: 135 mmol/L (ref 135–145)
Total Bilirubin: 0.7 mg/dL (ref 0.3–1.2)
Total Protein: 7.1 g/dL (ref 6.5–8.1)

## 2021-11-23 LAB — CBC
HCT: 46.5 % (ref 39.0–52.0)
Hemoglobin: 14.8 g/dL (ref 13.0–17.0)
MCH: 26.8 pg (ref 26.0–34.0)
MCHC: 31.8 g/dL (ref 30.0–36.0)
MCV: 84.2 fL (ref 80.0–100.0)
Platelets: 302 10*3/uL (ref 150–400)
RBC: 5.52 MIL/uL (ref 4.22–5.81)
RDW: 14 % (ref 11.5–15.5)
WBC: 21.4 10*3/uL — ABNORMAL HIGH (ref 4.0–10.5)
nRBC: 0 % (ref 0.0–0.2)

## 2021-11-23 LAB — URINALYSIS, ROUTINE W REFLEX MICROSCOPIC
Bilirubin Urine: NEGATIVE
Glucose, UA: NEGATIVE mg/dL
Hgb urine dipstick: NEGATIVE
Ketones, ur: NEGATIVE mg/dL
Leukocytes,Ua: NEGATIVE
Nitrite: NEGATIVE
Protein, ur: NEGATIVE mg/dL
Specific Gravity, Urine: 1.013 (ref 1.005–1.030)
pH: 7 (ref 5.0–8.0)

## 2021-11-23 LAB — RESP PANEL BY RT-PCR (FLU A&B, COVID) ARPGX2
Influenza A by PCR: NEGATIVE
Influenza B by PCR: NEGATIVE
SARS Coronavirus 2 by RT PCR: NEGATIVE

## 2021-11-23 LAB — LIPASE, BLOOD: Lipase: 51 U/L (ref 11–51)

## 2021-11-23 MED ORDER — IOHEXOL 300 MG/ML  SOLN
100.0000 mL | Freq: Once | INTRAMUSCULAR | Status: AC | PRN
  Administered 2021-11-23: 100 mL via INTRAVENOUS

## 2021-11-23 MED ORDER — ONDANSETRON HCL 4 MG PO TABS: 4.0000 mg | ORAL_TABLET | Freq: Three times a day (TID) | ORAL | 0 refills | Status: DC | PRN

## 2021-11-23 MED ORDER — AMOXICILLIN-POT CLAVULANATE 875-125 MG PO TABS
1.0000 | ORAL_TABLET | Freq: Two times a day (BID) | ORAL | 0 refills | Status: DC
Start: 2021-11-23 — End: 2022-12-24

## 2021-11-23 MED ORDER — ONDANSETRON HCL 4 MG/2ML IJ SOLN
4.0000 mg | Freq: Once | INTRAMUSCULAR | Status: AC
  Administered 2021-11-23: 4 mg via INTRAVENOUS
  Filled 2021-11-23: qty 2

## 2021-11-23 MED ORDER — SODIUM CHLORIDE 0.9 % IV BOLUS
1000.0000 mL | Freq: Once | INTRAVENOUS | Status: AC
  Administered 2021-11-23: 1000 mL via INTRAVENOUS

## 2021-11-23 MED ORDER — FENTANYL CITRATE PF 50 MCG/ML IJ SOSY
50.0000 ug | PREFILLED_SYRINGE | Freq: Once | INTRAMUSCULAR | Status: AC
  Administered 2021-11-23: 50 ug via INTRAVENOUS
  Filled 2021-11-23: qty 1

## 2021-11-23 NOTE — ED Triage Notes (Signed)
Pt to triage via [facility] from home.  C/o nausea, vomiting, and generalized abd pain since this morning. ?

## 2021-11-23 NOTE — ED Provider Notes (Signed)
[facility] EMERGENCY DEPARTMENT Provider Note   CSN: [id number] Arrival date & time: 11/23/21  1027     History  Chief Complaint  Patient presents with   Abdominal Pain    Dylan Burgess is a 61 y.o. male.   Abdominal Pain Associated symptoms: nausea and vomiting   Associated symptoms: no fatigue and no fever   Patient presents with nausea vomit abdominal pain.  Began this morning.  Feeling okay yesterday but states he may have felt a little off.  Denies fevers.  States pain is crampy.  No dysuria.  No known sick contacts.  No blood in the emesis.   Past Medical History:  Diagnosis Date   ADHD (attention deficit hyperactivity disorder)    Anxiety    Depression    Hypertension     Home Medications Prior to Admission medications   Medication Sig Start Date End Date Taking? Authorizing Provider  amoxicillin-clavulanate (AUGMENTIN) 875-125 MG tablet Take 1 tablet by mouth every 12 (twelve) hours. 11/23/21  Yes Benjiman Core, MD  alum & mag hydroxide-simeth (MAALOX MAX) 400-400-40 MG/5ML suspension Take 10 mLs by mouth every 6 (six) hours as needed for indigestion. 03/11/20   Mesner, Barbara Cower, MD  aspirin EC 81 MG tablet Take 81 mg by mouth daily.    [provider]  lisinopril-hydrochlorothiazide (ZESTORETIC) 20-25 MG tablet 1/2 tab by mouth in morning daily Patient taking differently: Take 0.5 tablets by mouth daily.  03/15/19   Julieanne Manson, MD  ondansetron (ZOFRAN) 4 MG tablet Take 1 tablet (4 mg total) by mouth every 8 (eight) hours as needed for nausea or vomiting. 11/23/21   Benjiman Core, MD  pantoprazole (PROTONIX) 20 MG tablet Take 1 tablet (20 mg total) by mouth daily. 03/11/20   Mesner, Barbara Cower, MD  metoprolol tartrate (LOPRESSOR) 25 MG tablet Take 1 tablet (25 mg total) by mouth 2 (two) times daily. Patient not taking: Reported on 03/15/2019 04/21/18 01/20/20  Julieanne Manson, MD      Allergies    Patient has no known allergies.     Review of Systems   Review of Systems  Constitutional:  Negative for fatigue and fever.  Gastrointestinal:  Positive for abdominal pain, nausea and vomiting.  Genitourinary:  Negative for flank pain.  Musculoskeletal:  Negative for back pain.  Skin:  Negative for rash.  Neurological:  Negative for weakness.   Physical Exam Updated Vital Signs BP (!) 165/88   Pulse 77   Temp 98.3 F (36.8 C) (Oral)   Resp 18   SpO2 98%  Physical Exam Vitals and nursing note reviewed.  HENT:     Head: Normocephalic.  Cardiovascular:     Rate and Rhythm: Regular rhythm. Tachycardia present.  Abdominal:     Hernia: No hernia is present.     Comments: Mild left-sided tenderness without rebound or guarding.  Patient appears uncomfortable.  Skin:    Capillary Refill: Capillary refill takes less than 2 seconds.  Neurological:     Mental Status: He is alert.    ED Results / Procedures / Treatments   Labs (all labs ordered are listed, but only abnormal results are displayed) Labs Reviewed  COMPREHENSIVE METABOLIC PANEL - Abnormal; Notable for the following components:      Result Value   Glucose, Bld 180 (*)    Calcium 8.8 (*)    All other components within normal limits  CBC - Abnormal; Notable for the following components:   WBC 21.4 (*)  All other components within normal limits  RESP PANEL BY RT-PCR (FLU A&B, COVID) [id number]  LIPASE, BLOOD  URINALYSIS, ROUTINE W REFLEX MICROSCOPIC    EKG None  Radiology CT ABDOMEN PELVIS W CONTRAST  Result Date: 11/23/2021 CLINICAL DATA:  61 year old male with history of left lower quadrant abdominal pain. EXAM: CT ABDOMEN AND PELVIS WITH CONTRAST TECHNIQUE: Multidetector CT imaging of the abdomen and pelvis was performed using the standard protocol following bolus administration of intravenous contrast. RADIATION DOSE REDUCTION: This exam was performed according to the departmental dose-optimization program which includes automated exposure  control, adjustment of the mA and/or kV according to patient size and/or use of iterative reconstruction technique. CONTRAST:  OMNIPAQUE IOHEXOL 300 MG/ML  SOLN COMPARISON:  No priors. FINDINGS: Comment: Portions of today's examination are limited by considerable patient respiratory motion. Lower chest: Ill-defined areas of ground-glass attenuation and thickening of the peribronchovascular interstitium are noted in the lower lobes of the lungs bilaterally, concerning for potential aspiration pneumonitis or developing pneumonia. Hepatobiliary: No definite suspicious cystic or solid hepatic lesions are confidently identified on today's motion limited CT examination. Gallbladder is unremarkable in appearance. Pancreas: No definite pancreatic mass or peripancreatic fluid collections or inflammatory changes are noted. Spleen: Spleen is partially obscured by motion, but visualized portions are unremarkable. Adrenals/Urinary Tract: Bilateral kidneys and adrenal glands as visualized are normal in appearance. No hydroureteronephrosis. Urinary bladder is normal in appearance. Stomach/Bowel: The appearance of the stomach is normal. No pathologic dilatation of small bowel or colon. The appendix is not confidently identified and may be surgically absent. Regardless, there are no inflammatory changes noted adjacent to the cecum to suggest the presence of an acute appendicitis at this time. Vascular/Lymphatic: No significant atherosclerotic disease, aneurysm or dissection noted in the abdominal or pelvic vasculature. No lymphadenopathy noted in the abdomen or pelvis. Reproductive: Prostate gland and seminal vesicles are unremarkable in appearance. Other: No significant volume of ascites.  No pneumoperitoneum. Musculoskeletal: There are no aggressive appearing lytic or blastic lesions noted in the visualized portions of the skeleton. IMPRESSION: 1. No acute findings are noted in the abdomen or pelvis to account for the  patient's symptoms. 2. However, there is evidence of potential aspiration pneumonitis or developing bronchopneumonia in the lower lobes of the lungs bilaterally. Electronically Signed   By: Trudie Reed M.D.   On: 11/23/2021 12:29   DG Chest Portable 1 View  Result Date: 11/23/2021 CLINICAL DATA:  Pt c/o nausea, vomiting, and generalized abd pain since this morning EXAM: PORTABLE CHEST 1 VIEW COMPARISON:  08/14/2020 FINDINGS: Indistinct medial bibasilar interstitial and patchy airspace opacity. Heart size within normal limits for positioning. Mediastinal contours unremarkable. No significant bony abnormalities observed. IMPRESSION: 1. Indistinct interstitial and faint airspace opacities medially at the lung bases. Possibilities include aspiration pneumonitis, atypical pneumonia, or multilobar pneumonia. Electronically Signed   By: Gaylyn Rong M.D.   On: 11/23/2021 13:09    Procedures Procedures    Medications Ordered in ED Medications  sodium chloride 0.9 % bolus 1,000 mL (0 mLs Intravenous Stopped 11/23/21 1442)  ondansetron (ZOFRAN) injection 4 mg (4 mg Intravenous Given 11/23/21 1150)  fentaNYL (SUBLIMAZE) injection 50 mcg (50 mcg Intravenous Given 11/23/21 1150)  iohexol (OMNIPAQUE) 300 MG/ML solution 100 mL (100 mLs Intravenous Contrast Given 11/23/21 1207)    ED Course/ Medical Decision Making/ A&P                           Medical  Decision Making Amount and/or Complexity of Data Reviewed Labs: ordered. Radiology: ordered.  Risk Prescription drug management.   Patient presents with nausea and vomiting abdominal pain.  Has had occasional cough.  Lab work does show leukocytosis.  CT scan independently interpreted and showed no intra-abdominal pathology but does have potential pneumonia.  Chest x-ray done and showed potential bilateral pneumonia.  Not hypoxic.  Patient now states he has been coughing some.  Appears stable for discharge home.  We will give antibiotics.   Outpatient follow-up as needed.  Does not seem to require admission to the hospital at this time.        Final Clinical Impression(s) / ED Diagnoses Final diagnoses:  Community acquired pneumonia, unspecified laterality    Rx / DC Orders ED Discharge Orders          Ordered    amoxicillin-clavulanate (AUGMENTIN) 875-125 MG tablet  Every 12 hours        11/23/21 1547    ondansetron (ZOFRAN) 4 MG tablet  Every 8 hours PRN        11/23/21 1547              Benjiman Core, MD 11/23/21 2101

## 2021-11-23 NOTE — Discharge Instructions (Addendum)
Follow-up with your doctor as needed.  Watch for worsening shortness of breath ?

## 2021-11-24 ENCOUNTER — Telehealth: Payer: Self-pay

## 2021-11-24 NOTE — Telephone Encounter (Signed)
Patient called into say his scripts did not go through when he was here yesterday. Called [facility] on [address] and called in as written. Patient aware of location and that they are being processed. ?

## 2022-05-18 ENCOUNTER — Other Ambulatory Visit: Payer: Self-pay

## 2022-05-18 ENCOUNTER — Encounter (HOSPITAL_COMMUNITY): Payer: Self-pay | Admitting: Emergency Medicine

## 2022-05-18 ENCOUNTER — Emergency Department (HOSPITAL_COMMUNITY)
Admission: EM | Admit: 2022-05-18 | Discharge: 2022-05-18 | Disposition: A | Discharge status: Home / Self Care | Payer: Self-pay | Attending: Emergency Medicine | Admitting: Emergency Medicine

## 2022-05-18 ENCOUNTER — Emergency Department (HOSPITAL_COMMUNITY): Payer: Self-pay

## 2022-05-18 DIAGNOSIS — Z7902 Long term (current) use of antithrombotics/antiplatelets: Secondary | ICD-10-CM | POA: Insufficient documentation

## 2022-05-18 DIAGNOSIS — R200 Anesthesia of skin: Secondary | ICD-10-CM

## 2022-05-18 DIAGNOSIS — Z7982 Long term (current) use of aspirin: Secondary | ICD-10-CM | POA: Insufficient documentation

## 2022-05-18 DIAGNOSIS — G459 Transient cerebral ischemic attack, unspecified: Secondary | ICD-10-CM | POA: Insufficient documentation

## 2022-05-18 LAB — CBC
HCT: 49.5 % (ref 39.0–52.0)
Hemoglobin: 16.4 g/dL (ref 13.0–17.0)
MCH: 27.5 pg (ref 26.0–34.0)
MCHC: 33.1 g/dL (ref 30.0–36.0)
MCV: 82.9 fL (ref 80.0–100.0)
Platelets: 287 10*3/uL (ref 150–400)
RBC: 5.97 MIL/uL — ABNORMAL HIGH (ref 4.22–5.81)
RDW: 13.7 % (ref 11.5–15.5)
WBC: 7.9 10*3/uL (ref 4.0–10.5)
nRBC: 0 % (ref 0.0–0.2)

## 2022-05-18 LAB — APTT: aPTT: 28 seconds (ref 24–36)

## 2022-05-18 LAB — RAPID URINE DRUG SCREEN, HOSP PERFORMED
Amphetamines: NOT DETECTED
Barbiturates: NOT DETECTED
Benzodiazepines: NOT DETECTED
Cocaine: NOT DETECTED
Opiates: NOT DETECTED
Tetrahydrocannabinol: NOT DETECTED

## 2022-05-18 LAB — PROTIME-INR
INR: 1 (ref 0.8–1.2)
Prothrombin Time: 13.1 seconds (ref 11.4–15.2)

## 2022-05-18 LAB — BASIC METABOLIC PANEL
Anion gap: 8 (ref 5–15)
BUN: 13 mg/dL (ref 6–20)
CO2: 26 mmol/L (ref 22–32)
Calcium: 9 mg/dL (ref 8.9–10.3)
Chloride: 106 mmol/L (ref 98–111)
Creatinine, Ser: 1.11 mg/dL (ref 0.61–1.24)
GFR, Estimated: >60 mL/min (ref >60)
Glucose, Bld: 120 mg/dL — ABNORMAL HIGH (ref 70–99)
Potassium: 3.6 mmol/L (ref 3.5–5.1)
Sodium: 140 mmol/L (ref 135–145)

## 2022-05-18 LAB — URINALYSIS, ROUTINE W REFLEX MICROSCOPIC
Bilirubin Urine: NEGATIVE
Glucose, UA: NEGATIVE mg/dL
Hgb urine dipstick: NEGATIVE
Ketones, ur: NEGATIVE mg/dL
Leukocytes,Ua: NEGATIVE
Nitrite: NEGATIVE
Protein, ur: NEGATIVE mg/dL
Specific Gravity, Urine: 1.001 — ABNORMAL LOW (ref 1.005–1.030)
pH: 6 (ref 5.0–8.0)

## 2022-05-18 LAB — ETHANOL: Alcohol, Ethyl (B): <10 mg/dL (ref <10)

## 2022-05-18 LAB — CBG MONITORING, ED: Glucose-Capillary: 187 mg/dL — ABNORMAL HIGH (ref 70–99)

## 2022-05-18 MED ORDER — ASPIRIN 325 MG PO TABS: 325.0000 mg | ORAL_TABLET | Freq: Every day | ORAL | Status: DC

## 2022-05-18 MED ORDER — CLOPIDOGREL BISULFATE 75 MG PO TABS: 75.0000 mg | ORAL_TABLET | Freq: Every day | ORAL | 0 refills | Status: AC

## 2022-05-18 NOTE — ED Provider Notes (Signed)
[name] Note   CSN: [id number] Arrival date & time: 05/18/22  1240     History  Chief Complaint  Patient presents with   Numbness    Dylan Burgess is a 61 y.o. male who presents to the ED complaining of left arm pulling sensation onset 2 hours ago. He denies recent injury, trauma, fall, heavy lifting.  Patient notes that it feels like there is a pulling sensation to the left side of his neck and arm.  Notes that he kept moving his left arm and neck due to the stiffness sensation that he felt.  Also noted that his left arm and left leg felt heavier for approximately 15 minutes and resolved. Denies anticoagulant use.  No meds tried prior to arrival.  Patient notes that his symptoms resolved while he was driving to the emergency department.  Denies chest pain, shortness of breath, neck pain, back pain.    The history is provided by the patient. No language interpreter was used.       Home Medications Prior to Admission medications   Medication Sig Start Date End Date Taking? Authorizing Provider  clopidogrel (PLAVIX) 75 MG tablet Take 1 tablet (75 mg total) by mouth daily for 21 days. 05/18/22 06/08/22 Yes Anu Stagner A, PA-C  alum & mag hydroxide-simeth (MAALOX MAX) 400-400-40 MG/5ML suspension Take 10 mLs by mouth every 6 (six) hours as needed for indigestion. 03/11/20   Mesner, Corene Cornea, MD  amoxicillin-clavulanate (AUGMENTIN) 875-125 MG tablet Take 1 tablet by mouth every 12 (twelve) hours. 11/23/21   Davonna Belling, MD  aspirin EC 81 MG tablet Take 81 mg by mouth daily.    [provider]  lisinopril-hydrochlorothiazide (ZESTORETIC) 20-25 MG tablet 1/2 tab by mouth in morning daily Patient taking differently: Take 0.5 tablets by mouth daily.  03/15/19   Mack Hook, MD  ondansetron (ZOFRAN) 4 MG tablet Take 1 tablet (4 mg total) by mouth every 8 (eight) hours as needed for nausea or vomiting. 11/23/21   Davonna Belling, MD  pantoprazole (PROTONIX) 20 MG tablet Take 1 tablet (20 mg total) by mouth daily. 03/11/20   Mesner, Corene Cornea, MD  metoprolol tartrate (LOPRESSOR) 25 MG tablet Take 1 tablet (25 mg total) by mouth 2 (two) times daily. Patient not taking: Reported on 03/15/2019 04/21/18 01/20/20  Mack Hook, MD      Allergies    Patient has no known allergies.    Review of Systems   Review of Systems  Respiratory:  Negative for shortness of breath.   Cardiovascular:  Negative for chest pain.  Musculoskeletal:  Negative for back pain and neck pain.  Neurological:  Negative for numbness.       -Tingling  All other systems reviewed and are negative.   Physical Exam Updated Vital Signs BP (!) 167/98 (BP Location: Right Arm)   Pulse 87   Temp 98.4 F (36.9 C) (Oral)   Resp 18   Ht 6' (1.829 m)   Wt 97.5 kg   SpO2 99%   BMI 29.16 kg/m  Physical Exam Vitals and nursing note reviewed.  Constitutional:      General: He is not in acute distress.    Appearance: He is not diaphoretic.  HENT:     Head: Normocephalic and atraumatic.     Mouth/Throat:     Pharynx: No oropharyngeal exudate.  Eyes:     General: No scleral icterus.    Conjunctiva/sclera: Conjunctivae normal.  Cardiovascular:  Rate and Rhythm: Normal rate and regular rhythm.     Pulses: Normal pulses.     Heart sounds: Normal heart sounds.  Pulmonary:     Effort: Pulmonary effort is normal. No respiratory distress.     Breath sounds: Normal breath sounds. No wheezing.  Abdominal:     General: Bowel sounds are normal.     Palpations: Abdomen is soft. There is no mass.     Tenderness: There is no abdominal tenderness. There is no guarding or rebound.  Musculoskeletal:        General: Normal range of motion.     Cervical back: Normal range of motion and neck supple.  Skin:    General: Skin is warm and dry.  Neurological:     General: No focal deficit present.     Mental Status: He is alert.     Cranial Nerves:  Cranial nerves 2-12 are intact.     Sensory: Sensation is intact.     Motor: Motor function is intact. No pronator drift.  Psychiatric:        Behavior: Behavior normal.     ED Results / Procedures / Treatments   Labs (all labs ordered are listed, but only abnormal results are displayed) Labs Reviewed  BASIC METABOLIC PANEL - Abnormal; Notable for the following components:      Result Value   Glucose, Bld 120 (*)    All other components within normal limits  CBC - Abnormal; Notable for the following components:   RBC 5.97 (*)    All other components within normal limits  URINALYSIS, ROUTINE W REFLEX MICROSCOPIC - Abnormal; Notable for the following components:   Color, Urine COLORLESS (*)    Specific Gravity, Urine 1.001 (*)    All other components within normal limits  CBG MONITORING, ED - Abnormal; Notable for the following components:   Glucose-Capillary 187 (*)    All other components within normal limits  RAPID URINE DRUG SCREEN, HOSP PERFORMED  ETHANOL  PROTIME-INR  APTT    EKG EKG Interpretation  Date/Time:  Sunday May 18 2022 12:45:31 EDT Ventricular Rate:  88 PR Interval:  196 QRS Duration: 88 QT Interval:  352 QTC Calculation: 425 R Axis:   -12 Text Interpretation: Normal sinus rhythm with sinus arrhythmia Inferior infarct , age undetermined Abnormal ECG When compared with ECG of 14-Aug-2020 05:48, PREVIOUS ECG IS PRESENT Confirmed by Regan Lemming ([id number]) on 05/18/2022 1:29:16 PM  Radiology CT HEAD WO CONTRAST  Result Date: 05/18/2022 CLINICAL DATA:  Neck pain. EXAM: CT HEAD WITHOUT CONTRAST CT CERVICAL SPINE WITHOUT CONTRAST TECHNIQUE: Multidetector CT imaging of the head and cervical spine was performed following the standard protocol without intravenous contrast. Multiplanar CT image reconstructions of the cervical spine were also generated. RADIATION DOSE REDUCTION: This exam was performed according to the departmental dose-optimization program which  includes automated exposure control, adjustment of the mA and/or kV according to patient size and/or use of iterative reconstruction technique. COMPARISON:  CT head 06/21/2017. FINDINGS: CT HEAD FINDINGS Brain: No evidence of acute infarction, hemorrhage, hydrocephalus, extra-axial collection or mass lesion/mass effect. Vascular: No hyperdense vessel or unexpected calcification. Skull: Normal. Negative for fracture or focal lesion. Sinuses/Orbits: No acute finding. Other: None CT CERVICAL SPINE FINDINGS Alignment: Reversal of normal cervical lordosis is noted which may reflect patient positioning or muscle spasm. No acute or posttraumatic malalignment of the cervical spine. Skull base and vertebrae: No acute fracture. No primary bone lesion or focal pathologic process. Soft tissues and  spinal canal: No prevertebral fluid or swelling. No visible canal hematoma. Disc levels: Multilevel disc space narrowing with endplate spurring is noted at C3-4 through C6-7. Facet joints are all appear well aligned. No significant facet arthropathy. Upper chest: Negative. Other: None IMPRESSION: 1. No acute intracranial abnormalities. 2. No evidence for cervical spine fracture or subluxation. 3. Cervical degenerative disc disease. Electronically Signed   By: Signa Kell M.D.   On: 05/18/2022 14:25   CT Cervical Spine Wo Contrast  Result Date: 05/18/2022 CLINICAL DATA:  Neck pain. EXAM: CT HEAD WITHOUT CONTRAST CT CERVICAL SPINE WITHOUT CONTRAST TECHNIQUE: Multidetector CT imaging of the head and cervical spine was performed following the standard protocol without intravenous contrast. Multiplanar CT image reconstructions of the cervical spine were also generated. RADIATION DOSE REDUCTION: This exam was performed according to the departmental dose-optimization program which includes automated exposure control, adjustment of the mA and/or kV according to patient size and/or use of iterative reconstruction technique. COMPARISON:   CT head 06/21/2017. FINDINGS: CT HEAD FINDINGS Brain: No evidence of acute infarction, hemorrhage, hydrocephalus, extra-axial collection or mass lesion/mass effect. Vascular: No hyperdense vessel or unexpected calcification. Skull: Normal. Negative for fracture or focal lesion. Sinuses/Orbits: No acute finding. Other: None CT CERVICAL SPINE FINDINGS Alignment: Reversal of normal cervical lordosis is noted which may reflect patient positioning or muscle spasm. No acute or posttraumatic malalignment of the cervical spine. Skull base and vertebrae: No acute fracture. No primary bone lesion or focal pathologic process. Soft tissues and spinal canal: No prevertebral fluid or swelling. No visible canal hematoma. Disc levels: Multilevel disc space narrowing with endplate spurring is noted at C3-4 through C6-7. Facet joints are all appear well aligned. No significant facet arthropathy. Upper chest: Negative. Other: None IMPRESSION: 1. No acute intracranial abnormalities. 2. No evidence for cervical spine fracture or subluxation. 3. Cervical degenerative disc disease. Electronically Signed   By: Signa Kell M.D.   On: 05/18/2022 14:25    Procedures Procedures    Medications Ordered in ED Medications  aspirin tablet 325 mg (has no administration in time range)    ED Course/ Medical Decision Making/ A&P Clinical Course as of 05/18/22 1545  Sun May 18, 2022  1439 Discussed with patient lab and imaging findings. Discussed with patient that we will proceed with consult with neurologist. Pt agreeable at this time.  [SB]  1500 Consult with neurologist, Dr. Amada Jupiter who recommends. [SB]  1505 Discussed with patient recommendations as per neurologist for TIA work-up and admission to the hospital.  At this time patient declines. [SB]  1507 Neurologist, Dr. Luisa Hart in to discuss admission plans with patient. [SB]  1514 Neurologist evaluated patient and patient noted that at this time he doesn't want to be  admitted to the hospital for a TIA workup. Neurologist went into details regarding patients risk stratification score for a stroke and patient declined.  [SB]    Clinical Course User Index [SB] Norberta Stobaugh A, PA-C                           Medical Decision Making Amount and/or Complexity of Data Reviewed Labs: ordered. Radiology: ordered.  Risk OTC drugs.   Pt presents with concerns for left arm pulling sensation onset 2 hours ago.  Also notes concern for left arm and leg heaviness.  Denies numbness, tingling, weakness.  Patient afebrile.  On exam patient without focal neurological deficits.  Negative pronator drift.  No acute cardiovascular respiratory  exam findings.  Differential diagnosis includes TIA, CVA, herniation, strain, spasm.  Co morbidities that complicate the patient evaluation: Hypertension  Labs:  I ordered, and personally interpreted labs.  The pertinent results include:   Coags unremarkable. Ethanol unremarkable. CBG at 187. UDS unremarkable. BMP unremarkable. Urinalysis unremarkable CBC without leukocytosis  Imaging: I ordered imaging studies including CT head without CT cervical spine I independently visualized and interpreted imaging which showed: No acute intracranial findings.  No acute fracture, herniation I agree with the radiologist interpretation  Medications:  I ordered medication including aspirin  I have reviewed the patients home medicines and have made adjustments as needed   Consultations: I requested consultation with the Neurologist, Dr. Amada Jupiter and discussed lab and imaging findings as well as pertinent plan - they recommend: Admission to the hospital for TIA work-up  Social Determinants of Health: Patient does not have a primary care provider at this time.  Disposition: Presentation suspicious for TIA.  Doubt CVA at this time.  Doubt herniation, strain, spasm at this time. After consideration of the diagnostic results and the  patients response to treatment, I feel that the patient would benefit from Admission to the hospital.  Discussed with patient plans for admission for TIA work-up, at this time the patient declines admission at this time.  Patient also spoke with neurologist, Dr. Amada Jupiter and spoke with the patient regarding the stratification as well as recommendation for admission to the hospital for TIA work-up, patient declined admission at this time.  Dr. Amada Jupiter recommended sending patient home with a prescription for Plavix. Patient leaving AMA at this time.  Patient provided with information for neurologist.  Patient aware of implications of leaving without full work-up as described to him by the neurologist.   This chart was dictated using voice recognition software, Dragon. Despite the best efforts of this provider to proofread and correct errors, errors may still occur which can change documentation meaning.   Final Clinical Impression(s) / ED Diagnoses Final diagnoses:  Numbness  TIA (transient ischemic attack)    Rx / DC Orders ED Discharge Orders          Ordered    clopidogrel (PLAVIX) 75 MG tablet  Daily        05/18/22 1541              Lathen Seal A, PA-C 05/18/22 1545    Ernie Avena, MD 05/18/22 1857

## 2022-05-18 NOTE — ED Triage Notes (Signed)
Patient arrives ambulatory by POV to triage c/o having a pulling sensation to his neck with less sided numbness about 2 hours. Patient states he has anxiety and is concerned he is having a stroke. Equal grip strength and sensation. No neuro deficits noted in triage.

## 2022-05-18 NOTE — Consult Note (Signed)
Neurology Consultation Reason for Consult: Transient left-sided heaviness Referring Physician: Phillips Grout  CC: Transient left-sided weakness  History is obtained from: Patient  HPI: Dylan Burgess is a 61 y.o. male with history of anxiety, hypertension, ADHD who presents with transient left-sided heaviness that started this morning.  He states it lasted for approximately 15 minutes.  He denies any accompanying numbness.  He denies any difficulty walking during that timeframe.  He states that he just felt uncomfortable and like he could not move it the way it was supposed to, feeling like it was heavy.   LKW: 11 AM tpa given?: no, resolution of symptoms   due to altered mental status.   Past Medical History:  Diagnosis Date   ADHD (attention deficit hyperactivity disorder)    Anxiety    Depression    Hypertension      Family History  Problem Relation Age of Onset   Kidney disease Mother        was on dialysis at time of death   Diabetes Mother    Hypertension Mother    Hypertension Sister    Kidney disease Brother        kidney failure on dialysis   Hypertension Brother        Cause of kidney failure   Obesity Daughter    Diabetes Brother    Hyperlipidemia Brother    Hyperlipidemia Brother      Social History:  reports that he quit smoking about 19 years ago. His smoking use included cigarettes. He has never used smokeless tobacco. He reports that he does not currently use alcohol. He reports that he does not use drugs.   Exam: Current vital signs: BP (!) 167/98 (BP Location: Right Arm)   Pulse 87   Temp 98.4 F (36.9 C) (Oral)   Resp 18   Ht 6' (1.829 m)   Wt 97.5 kg   SpO2 99%   BMI 29.16 kg/m  Vital signs in last 24 hours: Temp:  [98.4 F (36.9 C)] 98.4 F (36.9 C) (09/03 1245) Pulse Rate:  [87] 87 (09/03 1245) Resp:  [18] 18 (09/03 1245) BP: (167)/(98) 167/98 (09/03 1245) SpO2:  [99 %] 99 % (09/03 1245) Weight:  [97.5 kg] 97.5 kg (09/03  1246)   Physical Exam  Constitutional: Appears well-developed and well-nourished.  Psych: Affect appropriate to situation Eyes: No scleral injection HENT: No OP obstruction MSK: no joint deformities.  Cardiovascular: Normal rate and regular rhythm.  Respiratory: Effort normal, non-labored breathing GI: Soft.  No distension. There is no tenderness.  Skin: WDI  Neuro: Mental Status: Patient is awake, alert, oriented to person, place, month, year, and situation. Patient is able to give a clear and coherent history. No signs of aphasia or neglect Cranial Nerves: II: Visual Fields are full. Pupils are equal, round, and reactive to light.   III,IV, VI: EOMI without ptosis or diploplia.  V: Facial sensation is symmetric to temperature VII: Facial movement is symmetric.  VIII: hearing is intact to voice X: Uvula elevates symmetrically XI: Shoulder shrug is symmetric. XII: tongue is midline without atrophy or fasciculations.  Motor: Tone is normal. Bulk is normal. 5/5 strength was present in all four extremities.  Sensory: Sensation is symmetric to light touch and temperature in the arms and legs. Cerebellar: FNF and HKS are intact bilaterally      I have reviewed labs in epic and the results pertinent to this consultation are: Creatinine 1.1 Glucose 120 CMP and BMP otherwise unremarkable  I have reviewed the images obtained: CT head-negative  Impression: 61 year old male with transient left-sided heaviness lasting approximately 15 minutes.  The symptoms are most consistent with a transient ischemic attack.  His ABCD2 score is four, and I recommended admission for risk factor stratification including telemetry, echocardiogram, lipid panel, A1c, vascular imaging, MRI brain.  The patient states that he does not want to pursue this due to financial concerns.  Given his ABCD2 is four, I would favor 3 weeks of Plavix in addition to aspirin, following which she can resume aspirin  monotherapy.  I discussed with him that based on her restratification (ABCD2) he was at high risk of having a stroke over the next couple of weeks, hence why I would favor admitting him.  He expressed understanding of this, but would not favor pursuing any further testing at this time.  Recommendations: 1) telemetry, echocardiogram, lipid panel, A1c, vascular imaging, MRI brain 2) Plavix and aspirin dual therapy for 3 weeks followed by aspirin monotherapy 3) if patient changes mind and decides not to leave AMA, please let us know.    Ritta Slot, MD [facility] [phone]  If 7pm- 7am, please page neurology on call as listed in AMION.

## 2022-05-18 NOTE — Discharge Instructions (Addendum)
It was a pleasure taking care of you today!  Your labs overall unremarkable today.  You will be sent a prescription for Plavix, take as directed.  Attached is information for the neurologist, call to set up a follow-up appointment regarding today's ED visit.  Return to the emergency department if you are experiencing increasing/worsening numbness, weakness, worsening symptoms.

## 2022-06-18 ENCOUNTER — Encounter (HOSPITAL_COMMUNITY): Payer: Self-pay | Admitting: Emergency Medicine

## 2022-06-18 ENCOUNTER — Other Ambulatory Visit: Payer: Self-pay

## 2022-06-18 ENCOUNTER — Emergency Department (HOSPITAL_COMMUNITY): Payer: Self-pay

## 2022-06-18 ENCOUNTER — Emergency Department (HOSPITAL_COMMUNITY)
Admission: EM | Admit: 2022-06-18 | Discharge: 2022-06-18 | Discharge status: Against Medical Advice | Payer: Self-pay | Attending: Emergency Medicine | Admitting: Emergency Medicine

## 2022-06-18 DIAGNOSIS — Z5321 Procedure and treatment not carried out due to patient leaving prior to being seen by health care provider: Secondary | ICD-10-CM | POA: Insufficient documentation

## 2022-06-18 DIAGNOSIS — R197 Diarrhea, unspecified: Secondary | ICD-10-CM | POA: Insufficient documentation

## 2022-06-18 DIAGNOSIS — R1010 Upper abdominal pain, unspecified: Secondary | ICD-10-CM | POA: Insufficient documentation

## 2022-06-18 LAB — COMPREHENSIVE METABOLIC PANEL
ALT: 20 U/L (ref 0–44)
AST: 25 U/L (ref 15–41)
Albumin: 4.1 g/dL (ref 3.5–5.0)
Alkaline Phosphatase: 65 U/L (ref 38–126)
Anion gap: 12 (ref 5–15)
BUN: 12 mg/dL (ref 6–20)
CO2: 24 mmol/L (ref 22–32)
Calcium: 8.9 mg/dL (ref 8.9–10.3)
Chloride: 102 mmol/L (ref 98–111)
Creatinine, Ser: 1.15 mg/dL (ref 0.61–1.24)
GFR, Estimated: >60 mL/min (ref >60)
Glucose, Bld: 160 mg/dL — ABNORMAL HIGH (ref 70–99)
Potassium: 3.3 mmol/L — ABNORMAL LOW (ref 3.5–5.1)
Sodium: 138 mmol/L (ref 135–145)
Total Bilirubin: 0.6 mg/dL (ref 0.3–1.2)
Total Protein: 7.5 g/dL (ref 6.5–8.1)

## 2022-06-18 LAB — CBC WITH DIFFERENTIAL/PLATELET
Abs Immature Granulocytes: 0.03 10*3/uL (ref 0.00–0.07)
Basophils Absolute: 0 10*3/uL (ref 0.0–0.1)
Basophils Relative: 0 %
Eosinophils Absolute: 0.2 10*3/uL (ref 0.0–0.5)
Eosinophils Relative: 2 %
HCT: 47.7 % (ref 39.0–52.0)
Hemoglobin: 15.5 g/dL (ref 13.0–17.0)
Immature Granulocytes: 0 %
Lymphocytes Relative: 34 %
Lymphs Abs: 3 10*3/uL (ref 0.7–4.0)
MCH: 27.2 pg (ref 26.0–34.0)
MCHC: 32.5 g/dL (ref 30.0–36.0)
MCV: 83.7 fL (ref 80.0–100.0)
Monocytes Absolute: 0.7 10*3/uL (ref 0.1–1.0)
Monocytes Relative: 8 %
Neutro Abs: 4.9 10*3/uL (ref 1.7–7.7)
Neutrophils Relative %: 56 %
Platelets: 294 10*3/uL (ref 150–400)
RBC: 5.7 MIL/uL (ref 4.22–5.81)
RDW: 13.8 % (ref 11.5–15.5)
WBC: 8.9 10*3/uL (ref 4.0–10.5)
nRBC: 0 % (ref 0.0–0.2)

## 2022-06-18 LAB — LIPASE, BLOOD: Lipase: 41 U/L (ref 11–51)

## 2022-06-18 NOTE — Progress Notes (Signed)
No answer when called for CT

## 2022-06-18 NOTE — ED Triage Notes (Signed)
Patient arrived with EMS from home reports pain across abdomen with loose stools this evening . No emesis or fever .

## 2022-06-18 NOTE — ED Provider Triage Note (Signed)
  Emergency Medicine Provider Triage Evaluation Note  MRN:  [id number]  Arrival date & time: 06/18/22    Medically screening exam initiated at 2:11 AM.   CC:   Abdominal Pain   HPI:  Dylan Burgess is a 61 y.o. year-old male presents to the ED with chief complaint of upper abdominal pain that started tonight.  Reports associated diarrhea.  Denies vomiting.  Denies prior abdominal surgeries.  Nothing makes symptoms better or worse.  Feels bloated.  History provided by patient ROS:  -As included in HPI PE:   Vitals:   06/18/22 0210  BP: (!) 113/48  Pulse: 93  Resp: 17  Temp: 98 F (36.7 C)  SpO2: 100%    Non-toxic appearing No respiratory distress  MDM:   I've ordered labs and imaging in triage to expedite lab/diagnostic workup.  Patient was informed that the remainder of the evaluation will be completed by another provider, this initial triage assessment does not replace that evaluation, and the importance of remaining in the ED until their evaluation is complete.    Montine Circle, PA-C 06/18/22 0211

## 2022-08-25 ENCOUNTER — Telehealth: Payer: Self-pay | Admitting: Family Medicine

## 2022-08-25 NOTE — Progress Notes (Signed)
The patient no-showed for appointment despite this provider sending direct link, reaching out via phone with no response and waiting for at least 10 minutes from appointment time for patient to join. They will be marked as a NS for this appointment/time.   Willim Turnage M Corrina Steffensen, NP    

## 2022-09-17 ENCOUNTER — Other Ambulatory Visit: Payer: Self-pay

## 2022-09-17 ENCOUNTER — Emergency Department (HOSPITAL_COMMUNITY)
Admission: EM | Admit: 2022-09-17 | Discharge: 2022-09-18 | Discharge status: Against Medical Advice | Payer: No Typology Code available for payment source | Attending: Emergency Medicine | Admitting: Emergency Medicine

## 2022-09-17 ENCOUNTER — Encounter (HOSPITAL_COMMUNITY): Payer: Self-pay | Admitting: Emergency Medicine

## 2022-09-17 ENCOUNTER — Emergency Department (HOSPITAL_COMMUNITY): Payer: No Typology Code available for payment source

## 2022-09-17 DIAGNOSIS — Z1152 Encounter for screening for COVID-19: Secondary | ICD-10-CM | POA: Diagnosis not present

## 2022-09-17 DIAGNOSIS — Z5321 Procedure and treatment not carried out due to patient leaving prior to being seen by health care provider: Secondary | ICD-10-CM | POA: Diagnosis not present

## 2022-09-17 DIAGNOSIS — E860 Dehydration: Secondary | ICD-10-CM | POA: Insufficient documentation

## 2022-09-17 DIAGNOSIS — R55 Syncope and collapse: Secondary | ICD-10-CM | POA: Diagnosis present

## 2022-09-17 LAB — CBC WITH DIFFERENTIAL/PLATELET
Abs Immature Granulocytes: 0.03 10*3/uL (ref 0.00–0.07)
Basophils Absolute: 0 10*3/uL (ref 0.0–0.1)
Basophils Relative: 0 %
Eosinophils Absolute: 0.1 10*3/uL (ref 0.0–0.5)
Eosinophils Relative: 2 %
HCT: 46.8 % (ref 39.0–52.0)
Hemoglobin: 15.6 g/dL (ref 13.0–17.0)
Immature Granulocytes: 0 %
Lymphocytes Relative: 22 %
Lymphs Abs: 2.1 10*3/uL (ref 0.7–4.0)
MCH: 27.5 pg (ref 26.0–34.0)
MCHC: 33.3 g/dL (ref 30.0–36.0)
MCV: 82.4 fL (ref 80.0–100.0)
Monocytes Absolute: 0.8 10*3/uL (ref 0.1–1.0)
Monocytes Relative: 8 %
Neutro Abs: 6.3 10*3/uL (ref 1.7–7.7)
Neutrophils Relative %: 68 %
Platelets: 314 10*3/uL (ref 150–400)
RBC: 5.68 MIL/uL (ref 4.22–5.81)
RDW: 14.1 % (ref 11.5–15.5)
WBC: 9.3 10*3/uL (ref 4.0–10.5)
nRBC: 0 % (ref 0.0–0.2)

## 2022-09-17 LAB — COMPREHENSIVE METABOLIC PANEL
ALT: 17 U/L (ref 0–44)
AST: 24 U/L (ref 15–41)
Albumin: 4 g/dL (ref 3.5–5.0)
Alkaline Phosphatase: 63 U/L (ref 38–126)
Anion gap: 9 (ref 5–15)
BUN: 21 mg/dL — ABNORMAL HIGH (ref 6–20)
CO2: 24 mmol/L (ref 22–32)
Calcium: 8.6 mg/dL — ABNORMAL LOW (ref 8.9–10.3)
Chloride: 99 mmol/L (ref 98–111)
Creatinine, Ser: 1.83 mg/dL — ABNORMAL HIGH (ref 0.61–1.24)
GFR, Estimated: 42 mL/min — ABNORMAL LOW (ref >60)
Glucose, Bld: 170 mg/dL — ABNORMAL HIGH (ref 70–99)
Potassium: 4 mmol/L (ref 3.5–5.1)
Sodium: 132 mmol/L — ABNORMAL LOW (ref 135–145)
Total Bilirubin: 0.6 mg/dL (ref 0.3–1.2)
Total Protein: 7.4 g/dL (ref 6.5–8.1)

## 2022-09-17 LAB — D-DIMER, QUANTITATIVE: D-Dimer, Quant: 0.73 ug/mL-FEU — ABNORMAL HIGH (ref 0.00–0.50)

## 2022-09-17 LAB — RESP PANEL BY RT-PCR (RSV, FLU A&B, COVID)  RVPGX2
Influenza A by PCR: NEGATIVE
Influenza B by PCR: NEGATIVE
Resp Syncytial Virus by PCR: NEGATIVE
SARS Coronavirus 2 by RT PCR: NEGATIVE

## 2022-09-17 NOTE — ED Provider Triage Note (Signed)
Emergency Medicine Provider Triage Evaluation Note  Dylan Burgess , a 62 y.o. male  was evaluated in triage.  Pt complains of syncope.  Patient reports he had a syncopal episode yesterday while he was getting. Denies any outside.  He reports that he is concerned about possibly being dehydrated as he has been able to eat and drink as well as he wants to the last day.  Denies any chest pain, shortness of breath, abdominal pain, nausea, vomiting, diarrhea.  Denies hitting head.  Review of Systems  Positive: As above Negative: As above  Physical Exam  BP 105/69   Pulse 77   Temp 98.2 F (36.8 C) (Oral)   Resp 15   SpO2 93%  Gen:   Awake, no distress   Resp:  Normal effort  MSK:   Moves extremities without difficulty  Other:  EOMs intact, PERRL  Medical Decision Making  Medically screening exam initiated at 5:33 PM.  Appropriate orders placed.  Dylan Burgess was informed that the remainder of the evaluation will be completed by another provider, this initial triage assessment does not replace that evaluation, and the importance of remaining in the ED until their evaluation is complete.     Dylan Heller, PA-C 09/17/22 1734

## 2022-09-17 NOTE — ED Triage Notes (Signed)
Patient BIB [facility] from home for evaluation of generalized weakness and syncope yesterday, then felt like he might pass out against today but did not. Patient is alert, oriented, and in no apparent distress at this time.

## 2022-09-18 NOTE — ED Notes (Addendum)
NA x4 vitals 

## 2022-11-15 ENCOUNTER — Emergency Department (HOSPITAL_COMMUNITY): Payer: No Typology Code available for payment source

## 2022-11-15 ENCOUNTER — Other Ambulatory Visit: Payer: Self-pay

## 2022-11-15 ENCOUNTER — Emergency Department (HOSPITAL_COMMUNITY)
Admission: EM | Admit: 2022-11-15 | Discharge: 2022-11-15 | Disposition: A | Discharge status: Home / Self Care | Payer: No Typology Code available for payment source | Attending: Emergency Medicine | Admitting: Emergency Medicine

## 2022-11-15 DIAGNOSIS — Z7982 Long term (current) use of aspirin: Secondary | ICD-10-CM | POA: Insufficient documentation

## 2022-11-15 DIAGNOSIS — Z79899 Other long term (current) drug therapy: Secondary | ICD-10-CM | POA: Diagnosis not present

## 2022-11-15 DIAGNOSIS — R1084 Generalized abdominal pain: Secondary | ICD-10-CM | POA: Insufficient documentation

## 2022-11-15 DIAGNOSIS — Z87891 Personal history of nicotine dependence: Secondary | ICD-10-CM | POA: Diagnosis not present

## 2022-11-15 DIAGNOSIS — I10 Essential (primary) hypertension: Secondary | ICD-10-CM | POA: Insufficient documentation

## 2022-11-15 DIAGNOSIS — E876 Hypokalemia: Secondary | ICD-10-CM | POA: Insufficient documentation

## 2022-11-15 DIAGNOSIS — R1031 Right lower quadrant pain: Secondary | ICD-10-CM | POA: Insufficient documentation

## 2022-11-15 LAB — CBC WITH DIFFERENTIAL/PLATELET
Abs Immature Granulocytes: 0.02 10*3/uL (ref 0.00–0.07)
Basophils Absolute: 0 10*3/uL (ref 0.0–0.1)
Basophils Relative: 0 %
Eosinophils Absolute: 0.2 10*3/uL (ref 0.0–0.5)
Eosinophils Relative: 3 %
HCT: 48.2 % (ref 39.0–52.0)
Hemoglobin: 16.1 g/dL (ref 13.0–17.0)
Immature Granulocytes: 0 %
Lymphocytes Relative: 27 %
Lymphs Abs: 1.9 10*3/uL (ref 0.7–4.0)
MCH: 27.6 pg (ref 26.0–34.0)
MCHC: 33.4 g/dL (ref 30.0–36.0)
MCV: 82.5 fL (ref 80.0–100.0)
Monocytes Absolute: 0.7 10*3/uL (ref 0.1–1.0)
Monocytes Relative: 11 %
Neutro Abs: 4.2 10*3/uL (ref 1.7–7.7)
Neutrophils Relative %: 59 %
Platelets: 293 10*3/uL (ref 150–400)
RBC: 5.84 MIL/uL — ABNORMAL HIGH (ref 4.22–5.81)
RDW: 14.1 % (ref 11.5–15.5)
WBC: 7.1 10*3/uL (ref 4.0–10.5)
nRBC: 0 % (ref 0.0–0.2)

## 2022-11-15 LAB — COMPREHENSIVE METABOLIC PANEL
ALT: 22 U/L (ref 0–44)
AST: 23 U/L (ref 15–41)
Albumin: 3.9 g/dL (ref 3.5–5.0)
Alkaline Phosphatase: 76 U/L (ref 38–126)
Anion gap: 11 (ref 5–15)
BUN: 13 mg/dL (ref 6–20)
CO2: 24 mmol/L (ref 22–32)
Calcium: 8.8 mg/dL — ABNORMAL LOW (ref 8.9–10.3)
Chloride: 102 mmol/L (ref 98–111)
Creatinine, Ser: 1.15 mg/dL (ref 0.61–1.24)
GFR, Estimated: >60 mL/min (ref >60)
Glucose, Bld: 169 mg/dL — ABNORMAL HIGH (ref 70–99)
Potassium: 3.2 mmol/L — ABNORMAL LOW (ref 3.5–5.1)
Sodium: 137 mmol/L (ref 135–145)
Total Bilirubin: 0.5 mg/dL (ref 0.3–1.2)
Total Protein: 7.5 g/dL (ref 6.5–8.1)

## 2022-11-15 LAB — URINALYSIS, ROUTINE W REFLEX MICROSCOPIC
Bacteria, UA: NONE SEEN
Bilirubin Urine: NEGATIVE
Glucose, UA: NEGATIVE mg/dL
Ketones, ur: NEGATIVE mg/dL
Leukocytes,Ua: NEGATIVE
Nitrite: NEGATIVE
Protein, ur: NEGATIVE mg/dL
Specific Gravity, Urine: 1.002 — ABNORMAL LOW (ref 1.005–1.030)
pH: 7 (ref 5.0–8.0)

## 2022-11-15 LAB — LIPASE, BLOOD: Lipase: 49 U/L (ref 11–51)

## 2022-11-15 MED ORDER — ONDANSETRON HCL 4 MG/2ML IJ SOLN
4.0000 mg | Freq: Once | INTRAMUSCULAR | Status: AC
  Administered 2022-11-15: 4 mg via INTRAVENOUS
  Filled 2022-11-15: qty 2

## 2022-11-15 MED ORDER — POTASSIUM CHLORIDE CRYS ER 20 MEQ PO TBCR
40.0000 meq | EXTENDED_RELEASE_TABLET | Freq: Once | ORAL | Status: AC
  Administered 2022-11-15: 40 meq via ORAL
  Filled 2022-11-15: qty 2

## 2022-11-15 MED ORDER — IOHEXOL 350 MG/ML SOLN
75.0000 mL | Freq: Once | INTRAVENOUS | Status: AC | PRN
  Administered 2022-11-15: 75 mL via INTRAVENOUS

## 2022-11-15 MED ORDER — MORPHINE SULFATE (PF) 4 MG/ML IV SOLN
4.0000 mg | Freq: Once | INTRAVENOUS | Status: AC
  Administered 2022-11-15: 4 mg via INTRAVENOUS
  Filled 2022-11-15: qty 1

## 2022-11-15 MED ORDER — SODIUM CHLORIDE 0.9 % IV BOLUS
1000.0000 mL | Freq: Once | INTRAVENOUS | Status: AC
  Administered 2022-11-15: 1000 mL via INTRAVENOUS

## 2022-11-15 NOTE — ED Provider Notes (Signed)
[facility] Provider Note   CSN: [id number] Arrival date & time: 11/15/22  0343     History  Chief Complaint  Patient presents with   Abdominal Pain    Dylan Burgess is a 62 y.o. male.  62 year old male brought in by EMS with complaint of feeling bloated with abdominal discomfort and fecal urgency. Became anxious and checked his BP, found to be elevated and called 911. States he has been taking aspirin daily, unsure if related, no prior abdominal surgeries. Last bowel movement was 2 hours ago, reports formed and not loose, non bloody.  Former smoker. History of ADHD, depression, anxiety and HTN.        Home Medications Prior to Admission medications   Medication Sig Start Date End Date Taking? Authorizing Provider  alum & mag hydroxide-simeth (MAALOX MAX) 400-400-40 MG/5ML suspension Take 10 mLs by mouth every 6 (six) hours as needed for indigestion. 03/11/20   Mesner, Corene Cornea, MD  amoxicillin-clavulanate (AUGMENTIN) 875-125 MG tablet Take 1 tablet by mouth every 12 (twelve) hours. 11/23/21   Davonna Belling, MD  aspirin EC 81 MG tablet Take 81 mg by mouth daily.    [provider]  lisinopril-hydrochlorothiazide (ZESTORETIC) 20-25 MG tablet 1/2 tab by mouth in morning daily Patient taking differently: Take 0.5 tablets by mouth daily.  03/15/19   Mack Hook, MD  ondansetron (ZOFRAN) 4 MG tablet Take 1 tablet (4 mg total) by mouth every 8 (eight) hours as needed for nausea or vomiting. 11/23/21   Davonna Belling, MD  pantoprazole (PROTONIX) 20 MG tablet Take 1 tablet (20 mg total) by mouth daily. 03/11/20   Mesner, Corene Cornea, MD  metoprolol tartrate (LOPRESSOR) 25 MG tablet Take 1 tablet (25 mg total) by mouth 2 (two) times daily. Patient not taking: Reported on 03/15/2019 04/21/18 01/20/20  Mack Hook, MD      Allergies    Patient has no known allergies.    Review of Systems   Review of Systems Negative expect  as per HPI Physical Exam Updated Vital Signs BP (!) 142/73 (BP Location: Left Arm)   Pulse 87   Temp 98.2 F (36.8 C) (Oral)   Resp 18   Ht 6' (1.829 m)   Wt 97.5 kg   SpO2 99%   BMI 29.16 kg/m  Physical Exam Vitals and nursing note reviewed.  Constitutional:      General: He is not in acute distress.    Appearance: He is well-developed. He is not diaphoretic.  HENT:     Head: Normocephalic and atraumatic.  Cardiovascular:     Rate and Rhythm: Normal rate and regular rhythm.     Heart sounds: Normal heart sounds.  Pulmonary:     Effort: Pulmonary effort is normal.     Breath sounds: Normal breath sounds.  Abdominal:     General: Bowel sounds are decreased.     Tenderness: There is abdominal tenderness in the right lower quadrant. There is guarding.  Skin:    General: Skin is warm and dry.     Coloration: Skin is not pale.     Findings: No erythema or rash.  Neurological:     Mental Status: He is alert and oriented to person, place, and time.  Psychiatric:        Behavior: Behavior normal.     ED Results / Procedures / Treatments   Labs (all labs ordered are listed, but only abnormal results are displayed) Labs Reviewed  CBC WITH  DIFFERENTIAL/PLATELET - Abnormal; Notable for the following components:      Result Value   RBC 5.84 (*)    All other components within normal limits  COMPREHENSIVE METABOLIC PANEL - Abnormal; Notable for the following components:   Potassium 3.2 (*)    Glucose, Bld 169 (*)    Calcium 8.8 (*)    All other components within normal limits  URINALYSIS, ROUTINE W REFLEX MICROSCOPIC - Abnormal; Notable for the following components:   Color, Urine COLORLESS (*)    Specific Gravity, Urine 1.002 (*)    Hgb urine dipstick SMALL (*)    All other components within normal limits  LIPASE, BLOOD    EKG None  Radiology CT Abdomen Pelvis W Contrast  Result Date: 11/15/2022 CLINICAL DATA:  Right lower quadrant abdominal pain EXAM: CT ABDOMEN  AND PELVIS WITH CONTRAST TECHNIQUE: Multidetector CT imaging of the abdomen and pelvis was performed using the standard protocol following bolus administration of intravenous contrast. RADIATION DOSE REDUCTION: This exam was performed according to the departmental dose-optimization program which includes automated exposure control, adjustment of the mA and/or kV according to patient size and/or use of iterative reconstruction technique. CONTRAST:  26m OMNIPAQUE IOHEXOL 350 MG/ML S OLN COMPARISON:  11/23/2021 FINDINGS: Lower chest: Compared with the previous exam there is been progressive areas of bilateral peripheral ground-glassand airspace densities within the posterior lung bases and to a lesser extent the right middle lobe and lingula. Unchanged perifissural nodule within the left midlung measuring 5 mm, image 3/5. Subpleural nodule in the lateral left base measures 4 mm and is also unchanged from the previous exam. Hepatobiliary: No focal liver abnormality is seen. No gallstones, gallbladder wall thickening, or biliary dilatation. Pancreas: Unremarkable. No pancreatic ductal dilatation or surrounding inflammatory changes. Spleen: Normal in size without focal abnormality. Adrenals/Urinary Tract: Adrenal glands are unremarkable. Kidneys are normal, without renal calculi, focal lesion, or hydronephrosis. Bladder is unremarkable. Stomach/Bowel: Stomach appears within normal limits. Diminutive appendix is identified without signs of inflammation. Intramural fatty deposition is identified involving the terminal ileum. This is nonspecific but may be seen in the setting of chronic inflammation. No signs of acute inflammatory change identified at this time however. No pathologic dilatation of the large or small bowel loops to suggest obstruction. Vascular/Lymphatic: No significant vascular findings are present. No enlarged abdominal or pelvic lymph nodes. Reproductive: Prostate is unremarkable. Other: There is no free  fluid or fluid collections within the abdomen or pelvis. Small fat containing umbilical hernia identified. No pneumoperitoneum. Musculoskeletal: No acute or significant osseous findings. IMPRESSION: 1. No acute findings identified within the abdomen or pelvis. 2. Intramural fatty deposition is identified involving the terminal ileum. This is nonspecific but may be seen in the setting of chronic inflammation. No signs of acute inflammatory change identified at this time however. 3. Progressive areas of bilateral peripheral ground-glass and airspace densities within the posterior lung bases and to a lesser extent the right middle lobe and lingula. Findings are nonspecific but may be seen in the setting of atypical infection including viral etiologies. In the absence of any signs or symptoms of acute pneumonia these findings may reflect chronic interstitial lung disease. Consider follow-up imaging with nonemergent high-resolution CT of the chest for more definitive characterization. 4. Small fat containing umbilical hernia. 5. Unchanged left lung nodules measuring up to 5 mm. These are compatible with a benign process require no further follow-up. Electronically Signed   By: TKerby MoorsM.D.   On: 11/15/2022 06:39  Procedures Procedures    Medications Ordered in ED Medications  sodium chloride 0.9 % bolus 1,000 mL (1,000 mLs Intravenous New Bag/Given 11/15/22 0428)  ondansetron (ZOFRAN) injection 4 mg (4 mg Intravenous Given 11/15/22 0429)  morphine (PF) 4 MG/ML injection 4 mg (4 mg Intravenous Given 11/15/22 0429)  iohexol (OMNIPAQUE) 350 MG/ML injection 75 mL (75 mLs Intravenous Contrast Given 11/15/22 0555)  potassium chloride SA (KLOR-CON M) CR tablet 40 mEq (40 mEq Oral Given 11/15/22 [id number])    ED Course/ Medical Decision Making/ A&P                             Medical Decision Making Amount and/or Complexity of Data Reviewed Labs: ordered. Radiology: ordered.  Risk Prescription drug  management.   This patient presents to the ED for concern of abdominal pain, this involves an extensive number of treatment options, and is a complaint that carries with it a high risk of complications and morbidity.  The differential diagnosis includes but not limited to, appendicitis, SBO, ascites, constipation   Co morbidities that complicate the patient evaluation  HTN, depression, adhd, anxiety   Additional history obtained:  Additional history obtained from EMS who contributes to history as above External records from outside source obtained and reviewed including prior Bps on file   Lab Tests:  I Ordered, and personally interpreted labs.  The pertinent results include:  lipase WNL. CMP with mild hypokalemia. UA with small hgb. CBC without significant findings.    Imaging Studies ordered:  I ordered imaging studies including CT a/p with contrast  I independently visualized and interpreted imaging which showed no acute abnormality, no free air  I agree with the radiologist interpretation   Cardiac Monitoring: / EKG:  The patient was maintained on a cardiac monitor.  I personally viewed and interpreted the cardiac monitored which showed an underlying rhythm of: sinus rhythm, rate 72  Problem List / ED Course / Critical interventions / Medication management  62 year old male with abdominal pain, elevated BP as above. RLQ pain on exam with diminished but present bowel sounds. CT negative for acute findings and appendicitis. Labs largely reassuring, mild hypokalemia, replaced orally. Recommend follow up with GI for chronic inflammatory changes.  I ordered medication including kdur  for hypokalemia  Reevaluation of the patient after these medicines showed that the patient improved I have reviewed the patients home medicines and have made adjustments as needed   Social Determinants of Health:  Has PCP   Test / Admission - Considered:  Dc to follow up with PCP with GI  referral for possible colonoscopy          Final Clinical Impression(s) / ED Diagnoses Final diagnoses:  Generalized abdominal pain  Hypokalemia    Rx / DC Orders ED Discharge Orders     None         Tacy Learn, PA-C 11/15/22 [place], April, MD 11/15/22 [phone]

## 2022-11-15 NOTE — ED Triage Notes (Addendum)
Patient C/O feeling "bloated and full,  like I needed to have a BM and couldn't and I became very anxious at at once"  Patient has been taking ASA '325mg'$  every day

## 2022-11-15 NOTE — Discharge Instructions (Signed)
Follow-up with GI, please call to schedule appointment. Check with your primary care provider.  Return to ER for worsening or concerning symptoms.

## 2022-12-24 ENCOUNTER — Encounter: Payer: Self-pay | Admitting: Nurse Practitioner

## 2022-12-24 ENCOUNTER — Other Ambulatory Visit: Payer: Self-pay

## 2022-12-24 ENCOUNTER — Ambulatory Visit: Payer: Medicaid Other | Attending: Nurse Practitioner | Admitting: Nurse Practitioner

## 2022-12-24 VITALS — BP 154/99 | HR 66 | Ht 72.0 in | Wt 222.2 lb

## 2022-12-24 DIAGNOSIS — Z7689 Persons encountering health services in other specified circumstances: Secondary | ICD-10-CM

## 2022-12-24 DIAGNOSIS — F32A Depression, unspecified: Secondary | ICD-10-CM

## 2022-12-24 DIAGNOSIS — K219 Gastro-esophageal reflux disease without esophagitis: Secondary | ICD-10-CM

## 2022-12-24 DIAGNOSIS — E876 Hypokalemia: Secondary | ICD-10-CM | POA: Diagnosis not present

## 2022-12-24 DIAGNOSIS — Z23 Encounter for immunization: Secondary | ICD-10-CM | POA: Diagnosis not present

## 2022-12-24 DIAGNOSIS — I10 Essential (primary) hypertension: Secondary | ICD-10-CM | POA: Diagnosis not present

## 2022-12-24 DIAGNOSIS — F419 Anxiety disorder, unspecified: Secondary | ICD-10-CM | POA: Diagnosis not present

## 2022-12-24 DIAGNOSIS — E7800 Pure hypercholesterolemia, unspecified: Secondary | ICD-10-CM

## 2022-12-24 DIAGNOSIS — Z1211 Encounter for screening for malignant neoplasm of colon: Secondary | ICD-10-CM

## 2022-12-24 DIAGNOSIS — Z125 Encounter for screening for malignant neoplasm of prostate: Secondary | ICD-10-CM

## 2022-12-24 MED ORDER — HYDROXYZINE HCL 10 MG PO TABS: 10.0000 mg | ORAL_TABLET | Freq: Three times a day (TID) | ORAL | 3 refills | Status: DC | PRN

## 2022-12-24 MED ORDER — BLOOD PRESSURE MONITOR DEVI: 0 refills | Status: DC

## 2022-12-24 MED ORDER — TETANUS-DIPHTH-ACELL PERTUSSIS 5-2-15.5 LF-MCG/0.5 IM SUSP
0.5000 mL | Freq: Once | INTRAMUSCULAR | 0 refills | Status: AC
  Filled 2022-12-24: qty 0.5, 1d supply, fill #0

## 2022-12-24 MED ORDER — LISINOPRIL-HYDROCHLOROTHIAZIDE 20-25 MG PO TABS: 1.0000 | ORAL_TABLET | Freq: Every day | ORAL | 9 refills | Status: DC

## 2022-12-24 MED ORDER — ZOSTER VAC RECOMB ADJUVANTED 50 MCG/0.5ML IM SUSR: 0.5000 mL | Freq: Once | INTRAMUSCULAR | 0 refills | Status: AC

## 2022-12-24 MED ORDER — ESCITALOPRAM OXALATE 10 MG PO TABS: 10.0000 mg | ORAL_TABLET | Freq: Every day | ORAL | 1 refills | Status: DC

## 2022-12-24 MED ORDER — FAMOTIDINE 40 MG PO TABS: 40.0000 mg | ORAL_TABLET | Freq: Every day | ORAL | 1 refills | Status: DC

## 2022-12-24 NOTE — Progress Notes (Signed)
Assessment & Plan:  Dylan Burgess was seen today for establish care.  Diagnoses and all orders for this visit:  Encounter to establish care  Colon cancer screening -     Cancel: Fecal occult blood, imunochemical([facility]) -     Ambulatory referral to Gastroenterology  Prostate cancer screening -     PSA  Hypokalemia -     CMP14+EGFR  Primary hypertension Poorly controlled -     CMP14+EGFR -     lisinopril-hydrochlorothiazide (ZESTORETIC) 20-25 MG tablet; Take 1 tablet by mouth daily. -     Blood Pressure Monitor DEVI; Please provide patient with insurance approved blood pressure monitor Continue all antihypertensives as prescribed.  Reminded to bring in blood pressure log for follow  up appointment.  RECOMMENDATIONS: DASH/Mediterranean Diets are healthier choices for HTN.    Hypercholesterolemia -     Lipid panel INSTRUCTIONS: Work on a low fat, heart healthy diet and participate in regular aerobic exercise program by working out at least 150 minutes per week; 5 days a week-30 minutes per day. Avoid red meat/beef/steak,  fried foods. junk foods, sodas, sugary drinks, unhealthy snacking, alcohol and smoking.  Drink at least 80 oz of water per day and monitor your carbohydrate intake daily.    Anxiety and depression -     Ambulatory referral to Behavioral Health -     escitalopram (LEXAPRO) 10 MG tablet; Take 1 tablet (10 mg total) by mouth daily. -     hydrOXYzine (ATARAX) 10 MG tablet; Take 1-2.5 tablets (10-25 mg total) by mouth 3 (three) times daily as needed.  GERD without esophagitis -     famotidine (PEPCID) 40 MG tablet; Take 1 tablet (40 mg total) by mouth daily.  Need for Tdap vaccination -     Tdap (ADACEL) 01-14-14.5 LF-MCG/0.5 injection; Inject 0.5 mLs into the muscle once for 1 dose.  Need for shingles vaccine -     Zoster Vaccine Adjuvanted [facility]) injection; Inject 0.5 mLs into the muscle once for 1 dose.    Patient has been counseled on  age-appropriate routine health concerns for screening and prevention. These are reviewed and up-to-date. Referrals have been placed accordingly. Immunizations are up-to-date or declined.    Subjective:   Chief Complaint  Patient presents with   Establish Care   HPI Dylan Burgess 62 y.o. male presents to office today for HTN He is a new patient here requesting to establish care with Dr. Alvis Lemmings instead of me today. It appears his significant other is a patient of Dr. Baxter Flattery and she is requesting he establish with her.    Former smoker. History of ADHD, depression, anxiety and HTN.    HTN Was previously on metoprolol and zestoretic 10-12.5 mg daily when he was a patient of the [facility] clinic.  He does not check his blood pressure and does not have a monitor. BP is elevated today.  BP Readings from Last 3 Encounters:  12/24/22 (!) 154/99  11/15/22 (!) 153/85  09/18/22 122/78     Anxiety and Depression States this has been ongoing for about 17 years. Trying to apply for disability. I instructed him he would need to see a behavioral health specialist if this is something he is applying to get disability for.  He can not recall if he was taking an antidepressant in the past but there is a previous prescription for hydroxyzine in his chart. Does not currently endorse any thoughts of self harm.    12/24/2022    9:46 AM  Depression screen PHQ 2/9  Decreased Interest 3  Down, Depressed, Hopeless 3  PHQ - 2 Score 6  Altered sleeping 3  Tired, decreased energy 2  Change in appetite 2  Feeling bad or failure about yourself  2  Trouble concentrating 3  Moving slowly or fidgety/restless 0  Suicidal thoughts 0  PHQ-9 Score 18       12/24/2022    9:46 AM  GAD 7 : Generalized Anxiety Score  Nervous, Anxious, on Edge 3  Control/stop worrying 3  Worry too much - different things 3  Trouble relaxing 3  Restless 2  Easily annoyed or irritable 2  Afraid - awful might happen 0  Total  GAD 7 Score 16     GERD: Paitent complains of heartburn. This has been associated with abdominal bloating and and abdominal pain .  He denies hematemesis and melena. Symptoms have been present for several months. He denies dysphagia.  He has not lost weight. He denies hematochezia and coffee ground emesis. Medical therapy in the past has included proton pump inhibitors which were not prescribed but given to him by his significant other.   Review of Systems  Constitutional:  Negative for fever, malaise/fatigue and weight loss.  HENT: Negative.  Negative for nosebleeds.   Eyes: Negative.  Negative for blurred vision, double vision and photophobia.  Respiratory: Negative.  Negative for cough and shortness of breath.   Cardiovascular: Negative.  Negative for chest pain, palpitations and leg swelling.  Gastrointestinal:  Positive for heartburn. Negative for nausea and vomiting.  Musculoskeletal: Negative.  Negative for myalgias.  Neurological: Negative.  Negative for dizziness, focal weakness, seizures and headaches.  Psychiatric/Behavioral:  Positive for depression. Negative for suicidal ideas. The patient is nervous/anxious.     Past Medical History:  Diagnosis Date   ADHD (attention deficit hyperactivity disorder)    Anxiety    Depression    Hypertension     History reviewed. No pertinent surgical history.  Family History  Problem Relation Age of Onset   Kidney disease Mother        was on dialysis at time of death   Diabetes Mother    Hypertension Mother    Hypertension Sister    Kidney disease Brother        kidney failure on dialysis   Hypertension Brother        Cause of kidney failure   Obesity Daughter    Diabetes Brother    Hyperlipidemia Brother    Hyperlipidemia Brother     Social History Reviewed with no changes to be made today.   Outpatient Medications Prior to Visit  Medication Sig Dispense Refill   alum & mag hydroxide-simeth (MAALOX MAX) 400-400-40 MG/5ML  suspension Take 10 mLs by mouth every 6 (six) hours as needed for indigestion. (Patient not taking: Reported on 12/24/2022) 355 mL 0   amoxicillin-clavulanate (AUGMENTIN) 875-125 MG tablet Take 1 tablet by mouth every 12 (twelve) hours. (Patient not taking: Reported on 12/24/2022) 14 tablet 0   aspirin EC 81 MG tablet Take 81 mg by mouth daily. (Patient not taking: Reported on 12/24/2022)     lisinopril-hydrochlorothiazide (ZESTORETIC) 20-25 MG tablet 1/2 tab by mouth in morning daily (Patient not taking: Reported on 12/24/2022) 30 tablet 11   ondansetron (ZOFRAN) 4 MG tablet Take 1 tablet (4 mg total) by mouth every 8 (eight) hours as needed for nausea or vomiting. (Patient not taking: Reported on 12/24/2022) 12 tablet 0   pantoprazole (PROTONIX) 20 MG  tablet Take 1 tablet (20 mg total) by mouth daily. (Patient not taking: Reported on 12/24/2022) 30 tablet 0   No facility-administered medications prior to visit.    No Known Allergies     Objective:    BP (!) 154/99   Pulse 66   Ht 6' (1.829 m)   Wt 222 lb 3.2 oz (100.8 kg)   SpO2 96%   BMI 30.14 kg/m  Wt Readings from Last 3 Encounters:  12/24/22 222 lb 3.2 oz (100.8 kg)  11/15/22 215 lb (97.5 kg)  05/18/22 215 lb (97.5 kg)    Physical Exam Vitals and nursing note reviewed.  Constitutional:      Appearance: He is well-developed.  HENT:     Head: Normocephalic and atraumatic.  Cardiovascular:     Rate and Rhythm: Normal rate and regular rhythm.     Heart sounds: Normal heart sounds. No murmur heard.    No friction rub. No gallop.  Pulmonary:     Effort: Pulmonary effort is normal. No tachypnea or respiratory distress.     Breath sounds: Normal breath sounds. No decreased breath sounds, wheezing, rhonchi or rales.  Chest:     Chest wall: No tenderness.  Abdominal:     General: Bowel sounds are normal.     Palpations: Abdomen is soft.  Musculoskeletal:        General: Normal range of motion.     Cervical back: Normal range of  motion.  Skin:    General: Skin is warm and dry.  Neurological:     Mental Status: He is alert and oriented to person, place, and time.     Coordination: Coordination normal.  Psychiatric:        Behavior: Behavior normal. Behavior is cooperative.        Thought Content: Thought content normal.        Judgment: Judgment normal.          Patient has been counseled extensively about nutrition and exercise as well as the importance of adherence with medications and regular follow-up. The patient was given clear instructions to go to ER or return to medical center if symptoms don't improve, worsen or new problems develop. The patient verbalized understanding.   Follow-up: Return in about 8 weeks (around 02/18/2023) for patient wants to establish with Dr. Alvis Lemmings. Follow up for BP 8 weeks or after with [facility].   Claiborne Rigg, FNP-BC [facility] and [facility] [place], [place] [phone]   12/24/2022, 12:50 PM

## 2022-12-24 NOTE — Progress Notes (Signed)
Girlfriend Dylan Burgess is present in room.

## 2022-12-25 LAB — CMP14+EGFR
ALT: 17 IU/L (ref 0–44)
AST: 20 IU/L (ref 0–40)
Albumin/Globulin Ratio: 1.5 (ref 1.2–2.2)
Albumin: 4.4 g/dL (ref 3.8–4.9)
Alkaline Phosphatase: 93 IU/L (ref 44–121)
BUN/Creatinine Ratio: 12 (ref 9–20)
BUN: 12 mg/dL (ref 6–24)
Bilirubin Total: 0.3 mg/dL (ref 0.0–1.2)
CO2: 25 mmol/L (ref 20–29)
Calcium: 9.1 mg/dL (ref 8.7–10.2)
Chloride: 100 mmol/L (ref 96–106)
Creatinine, Ser: 1.04 mg/dL (ref 0.76–1.27)
Globulin, Total: 2.9 g/dL (ref 1.5–4.5)
Glucose: 159 mg/dL — ABNORMAL HIGH (ref 70–99)
Potassium: 3.9 mmol/L (ref 3.5–5.2)
Sodium: 139 mmol/L (ref 134–144)
Total Protein: 7.3 g/dL (ref 6.0–8.5)
eGFR: 83 mL/min/{1.73_m2} (ref >59)

## 2022-12-25 LAB — LIPID PANEL
Chol/HDL Ratio: 4.6 ratio (ref 0.0–5.0)
Cholesterol, Total: 204 mg/dL — ABNORMAL HIGH (ref 100–199)
HDL: 44 mg/dL (ref >39)
LDL Chol Calc (NIH): 144 mg/dL — ABNORMAL HIGH (ref 0–99)
Triglycerides: 89 mg/dL (ref 0–149)
VLDL Cholesterol Cal: 16 mg/dL (ref 5–40)

## 2022-12-25 LAB — PSA: Prostate Specific Ag, Serum: 1.6 ng/mL (ref 0.0–4.0)

## 2022-12-30 ENCOUNTER — Other Ambulatory Visit: Payer: Self-pay | Admitting: Nurse Practitioner

## 2022-12-30 DIAGNOSIS — E7800 Pure hypercholesterolemia, unspecified: Secondary | ICD-10-CM

## 2022-12-30 MED ORDER — ATORVASTATIN CALCIUM 20 MG PO TABS: 20.0000 mg | ORAL_TABLET | Freq: Every day | ORAL | 3 refills | Status: DC

## 2023-01-23 ENCOUNTER — Encounter: Payer: Self-pay | Admitting: Physician Assistant

## 2023-02-03 ENCOUNTER — Encounter: Payer: Self-pay | Admitting: Family Medicine

## 2023-02-04 ENCOUNTER — Other Ambulatory Visit: Payer: Self-pay | Admitting: Nurse Practitioner

## 2023-02-04 MED ORDER — SILDENAFIL CITRATE 100 MG PO TABS: 50.0000 mg | ORAL_TABLET | Freq: Every day | ORAL | 11 refills | Status: DC | PRN

## 2023-02-17 ENCOUNTER — Other Ambulatory Visit: Payer: Self-pay

## 2023-02-17 NOTE — Progress Notes (Addendum)
   Name Seese 1960-09-22 [id number]  Patient outreached by Christella Noa , PharmD Candidate on 02/17/23.  Blood Pressure Readings: Does the patient have a validated home blood pressure machine?: Yes   Medication review was performed. Is the patient taking their medications as prescribed?: Yes Differences from their prescribed list include: Pt not taking escitalopram. PCP switched to bupropion XL and is controlled.   The following barriers to adherence were noted: Does the patient have cost concerns?: No Does the patient have transportation concerns?: No Does the patient need assistance obtaining refills?: No Does the patient occassionally forget to take some of their prescribed medications?: No Does the patient feel like one/some of their medications make them feel poorly?: No Does the patient have questions or concerns about their medications?: No Does the patient have a follow up scheduled with their primary care provider/cardiologist?: No   Interventions: Interventions Completed: Medications were reviewed, Patient was educated on goal blood pressures and long term health implications of elevated blood pressure, Patient was educated on proper technique to check home blood pressure and reminded to bring home machine and readings to next provider appointment, Patient was educated on medications, including indication and administration. Pt had concerns about atorvastatin ADE of GI issues (frequent BM) and myalgias. Counseled pt to report ADE to PCP on 02/18/23 appointment to discuss and possibly switch statins. Pt reports having blood pressure machine at home, but does not know how to use/read it properly. Educated pt on systolic, diastolic, and HTN goal of <130/80.   The patient has follow up scheduled:  PCP: Hoy Register, MD   Christella Noa, Student-PharmD

## 2023-02-18 ENCOUNTER — Encounter: Payer: Self-pay | Admitting: Family Medicine

## 2023-02-18 ENCOUNTER — Ambulatory Visit: Payer: No Typology Code available for payment source | Attending: Family Medicine | Admitting: Family Medicine

## 2023-02-18 VITALS — BP 114/80 | HR 62 | Ht 72.0 in | Wt 210.8 lb

## 2023-02-18 DIAGNOSIS — E785 Hyperlipidemia, unspecified: Secondary | ICD-10-CM | POA: Diagnosis not present

## 2023-02-18 DIAGNOSIS — I10 Essential (primary) hypertension: Secondary | ICD-10-CM | POA: Diagnosis not present

## 2023-02-18 DIAGNOSIS — N529 Male erectile dysfunction, unspecified: Secondary | ICD-10-CM

## 2023-02-18 DIAGNOSIS — F419 Anxiety disorder, unspecified: Secondary | ICD-10-CM

## 2023-02-18 DIAGNOSIS — F32A Depression, unspecified: Secondary | ICD-10-CM

## 2023-02-18 MED ORDER — LISINOPRIL-HYDROCHLOROTHIAZIDE 20-25 MG PO TABS: 1.0000 | ORAL_TABLET | Freq: Every day | ORAL | 1 refills | Status: DC

## 2023-02-18 MED ORDER — ROSUVASTATIN CALCIUM 5 MG PO TABS
5.0000 mg | ORAL_TABLET | Freq: Every day | ORAL | 1 refills | Status: DC
Start: 2023-02-18 — End: 2023-08-20

## 2023-02-18 NOTE — Patient Instructions (Signed)
Rosuvastatin Tablets What is this medication? ROSUVASTATIN (roe SOO va sta tin) treats high cholesterol and reduces the risk of heart attack and stroke. It works by decreasing bad cholesterol and fats (such as LDL, triglycerides) and increasing good cholesterol (HDL) in your blood. It belongs to a group of medications called statins. Changes to diet and exercise are often combined with this medication. This medicine may be used for other purposes; ask your health care provider or pharmacist if you have questions. COMMON BRAND NAME(S): Crestor What should I tell my care team before I take this medication? They need to know if you have any of these conditions: Diabetes Frequently drink alcohol Kidney disease Liver disease Muscle cramps, pain Stroke Thyroid disease An unusual or allergic reaction to rosuvastatin, other medications, foods, dyes, or preservatives Pregnant or trying to get pregnant Breastfeeding How should I use this medication? Take this medication by mouth with a glass of water. Follow the directions on the prescription label. You can take it with or without food. If it upsets your stomach, take it with food. Do not cut, crush or chew this medication. Swallow the tablets whole. Take your medication at regular intervals. Do not take it more often than directed. Take antacids that have a combination of aluminum and magnesium hydroxide in them at a different time of day than this medication. Take these products 2 hours AFTER this medication. Talk to your care team about the use of this medication in children. While this medication may be prescribed for children as young as 7 for selected conditions, precautions do apply. Overdosage: If you think you have taken too much of this medicine contact a poison control center or emergency room at once. NOTE: This medicine is only for you. Do not share this medicine with others. What if I miss a dose? If you miss a dose, take it as soon as  you can. If your next dose is to be taken in less than 12 hours, then do not take the missed dose. Take the next dose at your regular time. Do not take double or extra doses. What may interact with this medication? Do not take this medication with any of the following: Supplements like red yeast rice This medication may also interact with the following: Alcohol Antacids containing aluminum hydroxide and magnesium hydroxide Cyclosporine Other medications for high cholesterol Some medications for HIV infection Warfarin This list may not describe all possible interactions. Give your health care provider a list of all the medicines, herbs, non-prescription drugs, or dietary supplements you use. Also tell them if you smoke, drink alcohol, or use illegal drugs. Some items may interact with your medicine. What should I watch for while using this medication? Visit your care team for regular checks on your progress. Tell your care team if your symptoms do not start to get better or if they get worse. Your care team may tell you to stop taking this medication if you develop muscle problems. If your muscle problems do not go away after stopping this medication, contact your care team. Talk to your care team if you may be pregnant. Serious birth defects can occur if you take this medication during pregnancy. Talk to your care team before breastfeeding. Changes to your treatment plan may be needed. This medication may increase blood sugar. Ask your care team if changes in diet or medications are needed if you have diabetes. If you are going to need surgery or other procedure, tell your care team that you  are using this medication. Taking this medication is only part of a total heart healthy program. Ask your care team if there are other changes you can make to improve your overall health. What side effects may I notice from receiving this medication? Side effects that you should report to your care team as  soon as possible: Allergic reactions--skin rash, itching, hives, swelling of the face, lips, tongue, or throat High blood sugar (hyperglycemia)--increased thirst or amount of urine, unusual weakness, fatigue, blurry vision Liver injury--right upper belly pain, loss of appetite, nausea, light-colored stool, dark yellow or brown urine, yellowing skin or eyes, unusual weakness, fatigue Muscle injury--unusual weakness, fatigue, muscle pain, dark yellow or brown urine, decrease in amount of urine Redness, blistering, peeling or loosening of the skin, including inside the mouth Side effects that usually do not require medical attention (report to your care team if they continue or are bothersome): Fatigue Headache Nausea Stomach pain This list may not describe all possible side effects. Call your doctor for medical advice about side effects. You may report side effects to FDA at 1-800-FDA-1088. Where should I keep my medication? Keep out of the reach of children and pets. Store between 20 and 25 degrees C (68 and 77 degrees F). Get rid of any unused medication after the expiration date. To get rid of medications that are no longer needed or have expired: Take the medication to a medication take-back program. Check with your pharmacy or law enforcement to find a location. If you cannot return the medication, check the label or package insert to see if the medication should be thrown out in the garbage or flushed down the toilet. If you are not sure, ask your care team. If it is safe to put it in the trash, take the medication out of the container. Mix the medication with cat litter, dirt, coffee grounds, or other unwanted substance. Seal the mixture in a bag or container. Put it in the trash. NOTE: This sheet is a summary. It may not cover all possible information. If you have questions about this medicine, talk to your doctor, pharmacist, or health care provider.  2024 Elsevier/Gold Standard  (2022-01-31 00:00:00)

## 2023-02-18 NOTE — Progress Notes (Signed)
Discuss changing Lipitor.

## 2023-02-18 NOTE — Progress Notes (Signed)
Subjective:  Patient ID: Dylan Burgess, male    DOB: 06/08/1961  Age: 62 y.o. MRN: [id number]  CC: Hypertension   HPI Dylan Burgess is a 62 y.o. year old male with a history of Anxiety and Depression, HTN, GERD. [facility]. Anxiety and depression is managed by Dr Jefferson Fuel at [facility].  Interval History:  He Complains Atorvastatin makes his bowels loose. He has been on it for 1 month.  He has also noticed some slight cramping. At his last visit his blood pressure was elevated but today his blood pressure is better. He was prescribed Viagra for erectile dysfunction but has been unable to obtain this from the pharmacy.  Past Medical History:  Diagnosis Date   ADHD (attention deficit hyperactivity disorder)    Anxiety    Depression    Hypertension     No past surgical history on file.  Family History  Problem Relation Age of Onset   Kidney disease Mother        was on dialysis at time of death   Diabetes Mother    Hypertension Mother    Hypertension Sister    Kidney disease Brother        kidney failure on dialysis   Hypertension Brother        Cause of kidney failure   Obesity Daughter    Diabetes Brother    Hyperlipidemia Brother    Hyperlipidemia Brother     Social History   Socioeconomic History   Marital status: Legally Separated    Spouse name: Not on file   Number of children: 5   Years of education: 13   Highest education level: Some college, no degree  Occupational History   Occupation: truck driver--short distance  Tobacco Use   Smoking status: Former    Years: 5    Types: Cigarettes    Quit date: 09/21/2002    Years since quitting: 20.4   Smokeless tobacco: Never  Vaping Use   Vaping Use: Never used  Substance and Sexual Activity   Alcohol use: Not Currently   Drug use: No   Sexual activity: Not on file  Other Topics Concern   Not on file  Social History Narrative    Originally from [place].   Difficult to get history   Has lived in [place] since 2007.   Lived in [place] since 1990   Children live in [place] or farther away   He currently lives with his girlfriend and 3 dogs.   Social Determinants of Health   Financial Resource Strain: Low Risk  (04/21/2018)   Overall Financial Resource Strain (CARDIA)    Difficulty of Paying Living Expenses: Not very hard  Food Insecurity: Food Insecurity Present (04/21/2018)   Hunger Vital Sign    Worried About Running Out of Food in the Last Year: Sometimes true    Ran Out of Food in the Last Year: Sometimes true  Transportation Needs: Unmet Transportation Needs (04/21/2018)   PRAPARE - Administrator, Civil Service (Medical): Yes    Lack of Transportation (Non-Medical): Yes  Physical Activity: Sufficiently Active (04/21/2018)   Exercise Vital Sign    Days of Exercise per Week: 7 days    Minutes of Exercise per Session: 60 min  Stress: Stress Concern Present (04/21/2018)   [employer] of Occupational Health - Occupational Stress Questionnaire    Feeling of Stress : To some extent  Social Connections: Unknown (04/21/2018)  Social Advertising account executive [NHANES]    Frequency of Communication with Friends and Family: More than three times a week    Frequency of Social Gatherings with Friends and Family: More than three times a week    Attends Religious Services: Not on Marketing executive or Organizations: Not on file    Attends Banker Meetings: Not on file    Marital Status: Not on file    No Known Allergies  Outpatient Medications Prior to Visit  Medication Sig Dispense Refill   Blood Pressure Monitor DEVI Please provide patient with insurance approved blood pressure monitor 1 each 0   buPROPion (WELLBUTRIN XL) 150 MG 24 hr tablet Take 150 mg by mouth every morning.     clonazePAM (KLONOPIN) 0.5 MG tablet Take 0.25-0.5 mg by mouth 2 (two) times daily as  needed for anxiety.     escitalopram (LEXAPRO) 10 MG tablet Take 1 tablet (10 mg total) by mouth daily. 90 tablet 1   famotidine (PEPCID) 40 MG tablet Take 1 tablet (40 mg total) by mouth daily. 90 tablet 1   sildenafil (VIAGRA) 100 MG tablet Take 0.5-1 tablets (50-100 mg total) by mouth daily as needed for erectile dysfunction. 5 tablet 11   atorvastatin (LIPITOR) 20 MG tablet Take 1 tablet (20 mg total) by mouth daily. 90 tablet 3   hydrOXYzine (ATARAX) 10 MG tablet Take 1-2.5 tablets (10-25 mg total) by mouth 3 (three) times daily as needed. 60 tablet 3   lisinopril-hydrochlorothiazide (ZESTORETIC) 20-25 MG tablet Take 1 tablet by mouth daily. 30 tablet 9   No facility-administered medications prior to visit.     ROS Review of Systems  Constitutional:  Negative for activity change and appetite change.  HENT:  Negative for sinus pressure and sore throat.   Respiratory:  Negative for chest tightness, shortness of breath and wheezing.   Cardiovascular:  Negative for chest pain and palpitations.  Gastrointestinal:  Positive for diarrhea. Negative for abdominal distention, abdominal pain and constipation.  Genitourinary: Negative.   Musculoskeletal: Negative.   Psychiatric/Behavioral:  Negative for behavioral problems and dysphoric mood.     Objective:  BP 114/80   Pulse 62   Ht 6' (1.829 m)   Wt 210 lb 12.8 oz (95.6 kg)   SpO2 97%   BMI 28.59 kg/m      02/18/2023    8:58 AM 12/24/2022    9:58 AM 12/24/2022    9:20 AM  BP/Weight  Systolic BP 114 154 156  Diastolic BP 80 99 96  Wt. (Lbs) 210.8  222.2  BMI 28.59 kg/m2  30.14 kg/m2      Physical Exam Constitutional:      Appearance: He is well-developed.  Cardiovascular:     Rate and Rhythm: Normal rate.     Heart sounds: Normal heart sounds. No murmur heard. Pulmonary:     Effort: Pulmonary effort is normal.     Breath sounds: Normal breath sounds. No wheezing or rales.  Chest:     Chest wall: No tenderness.   Abdominal:     General: Bowel sounds are normal. There is no distension.     Palpations: Abdomen is soft. There is no mass.     Tenderness: There is no abdominal tenderness.  Musculoskeletal:        General: Normal range of motion.     Right lower leg: No edema.     Left lower leg: No edema.  Neurological:  Mental Status: He is alert and oriented to person, place, and time.  Psychiatric:        Mood and Affect: Mood normal.        Latest Ref Rng & Units 12/24/2022   10:23 AM 11/15/2022    4:33 AM 09/17/2022    5:45 PM  CMP  Glucose 70 - 99 mg/dL 161  096  045   BUN 6 - 24 mg/dL 12  13  21    Creatinine 0.76 - 1.27 mg/dL 4.09  8.11  9.14   Sodium 134 - 144 mmol/L 139  137  132   Potassium 3.5 - 5.2 mmol/L 3.9  3.2  4.0   Chloride 96 - 106 mmol/L 100  102  99   CO2 20 - 29 mmol/L 25  24  24    Calcium 8.7 - 10.2 mg/dL 9.1  8.8  8.6   Total Protein 6.0 - 8.5 g/dL 7.3  7.5  7.4   Total Bilirubin 0.0 - 1.2 mg/dL 0.3  0.5  0.6   Alkaline Phos 44 - 121 IU/L 93  76  63   AST 0 - 40 IU/L 20  23  24    ALT 0 - 44 IU/L 17  22  17      Lipid Panel     Component Value Date/Time   CHOL 204 (H) 12/24/2022 1023   TRIG 89 12/24/2022 1023   HDL 44 12/24/2022 1023   CHOLHDL 4.6 12/24/2022 1023   LDLCALC 144 (H) 12/24/2022 1023   LDLDIRECT 112 (H) 01/11/2009 2207    CBC    Component Value Date/Time   WBC 7.1 11/15/2022 0433   RBC 5.84 (H) 11/15/2022 0433   HGB 16.1 11/15/2022 0433   HGB 15.3 04/21/2018 1150   HCT 48.2 11/15/2022 0433   HCT 45.6 04/21/2018 1150   PLT 293 11/15/2022 0433   PLT 297 04/21/2018 1150   MCV 82.5 11/15/2022 0433   MCV 85 04/21/2018 1150   MCH 27.6 11/15/2022 0433   MCHC 33.4 11/15/2022 0433   RDW 14.1 11/15/2022 0433   RDW 14.5 04/21/2018 1150   LYMPHSABS 1.9 11/15/2022 0433   LYMPHSABS 2.1 04/21/2018 1150   MONOABS 0.7 11/15/2022 0433   EOSABS 0.2 11/15/2022 0433   EOSABS 0.2 04/21/2018 1150   BASOSABS 0.0 11/15/2022 0433   BASOSABS 0.0  04/21/2018 1150    No results found for: "HGBA1C"  The 10-year ASCVD risk score (Arnett DK, et al., 2019) is: 10.9%   Values used to calculate the score:     Age: 26 years     Sex: Male     Is Non-Hispanic African American: Yes     Diabetic: No     Tobacco smoker: No     Systolic Blood Pressure: 114 mmHg     Is BP treated: Yes     HDL Cholesterol: 44 mg/dL     Total Cholesterol: 204 mg/dL  Assessment & Plan:  1. Primary hypertension Controlled Continue current regimen Counseled on blood pressure goal of less than 130/80, low-sodium, DASH diet, medication compliance, 150 minutes of moderate intensity exercise per week. Discussed medication compliance, adverse effects. - lisinopril-hydrochlorothiazide (ZESTORETIC) 20-25 MG tablet; Take 1 tablet by mouth daily.  Dispense: 90 tablet; Refill: 1  2. Anxiety and depression Stable Continue to follow-up with psychiatry - buPROPion (WELLBUTRIN XL) 150 MG 24 hr tablet; Take 150 mg by mouth every morning. - clonazePAM (KLONOPIN) 0.5 MG tablet; Take 0.25-0.5 mg by mouth 2 (two) times  daily as needed for anxiety.  3. Erectile dysfunction, unspecified erectile dysfunction type Uncontrolled Viagra prescription was sent to his pharmacy at his last visit. Advised to discuss with the pharmacy regarding if prior authorization is needed alternative  4. Dyslipidemia Uncontrolled 10-year ASCVD risk score of 10.9% Unable to tolerate atorvastatin Will discontinue atorvastatin and substitute with Crestor Advised that if he does develop cramps with Crestor he can take this every other day Low-cholesterol diet - rosuvastatin (CRESTOR) 5 MG tablet; Take 1 tablet (5 mg total) by mouth daily.  Dispense: 90 tablet; Refill: 1    Meds ordered this encounter  Medications   lisinopril-hydrochlorothiazide (ZESTORETIC) 20-25 MG tablet    Sig: Take 1 tablet by mouth daily.    Dispense:  90 tablet    Refill:  1   rosuvastatin (CRESTOR) 5 MG tablet     Sig: Take 1 tablet (5 mg total) by mouth daily.    Dispense:  90 tablet    Refill:  1    Intolerance to atorvastatin (diarrhea)    Follow-up: Return in about 6 months (around 08/20/2023) for Chronic medical conditions.       Hoy Register, MD, FAAFP. [facility] [place], [place] [phone]   02/18/2023, 9:28 AM

## 2023-02-24 ENCOUNTER — Other Ambulatory Visit: Payer: Self-pay | Admitting: Family Medicine

## 2023-02-24 ENCOUNTER — Encounter: Payer: Self-pay | Admitting: Family Medicine

## 2023-02-24 MED ORDER — LOSARTAN POTASSIUM 50 MG PO TABS
50.0000 mg | ORAL_TABLET | Freq: Every day | ORAL | 1 refills | Status: DC
Start: 2023-02-24 — End: 2023-08-20

## 2023-02-24 MED ORDER — AMLODIPINE BESYLATE 5 MG PO TABS: 5.0000 mg | ORAL_TABLET | Freq: Every day | ORAL | 1 refills | Status: DC

## 2023-04-01 ENCOUNTER — Ambulatory Visit: Payer: No Typology Code available for payment source | Admitting: Physician Assistant

## 2023-04-01 ENCOUNTER — Encounter: Payer: Self-pay | Admitting: Physician Assistant

## 2023-04-01 VITALS — BP 132/84 | HR 82 | Ht 72.0 in | Wt 209.5 lb

## 2023-04-01 DIAGNOSIS — R935 Abnormal findings on diagnostic imaging of other abdominal regions, including retroperitoneum: Secondary | ICD-10-CM

## 2023-04-01 DIAGNOSIS — K5909 Other constipation: Secondary | ICD-10-CM | POA: Diagnosis not present

## 2023-04-01 MED ORDER — NA SULFATE-K SULFATE-MG SULF 17.5-3.13-1.6 GM/177ML PO SOLN: 1.0000 | Freq: Once | ORAL | 0 refills | Status: AC

## 2023-04-01 NOTE — Patient Instructions (Addendum)
You have been scheduled for a colonoscopy. Please follow written instructions given to you at your visit today.  Plenvu Sample Given   Please pick up your prep supplies at the pharmacy within the next 1-3 days.  If you use inhalers (even only as needed), please bring them with you on the day of your procedure.  DO NOT TAKE 7 DAYS PRIOR TO TEST- Trulicity (dulaglutide) Ozempic, Wegovy (semaglutide) Mounjaro (tirzepatide) Bydureon Bcise (exanatide extended release)  DO NOT TAKE 1 DAY PRIOR TO YOUR TEST Rybelsus (semaglutide) Adlyxin (lixisenatide) Victoza (liraglutide) Byetta (exanatide) ___________________________________________________________________________  Follow up per recommendations after colonoscopy    Due to recent changes in healthcare laws, you may see the results of your imaging and laboratory studies on MyChart before your provider has had a chance to review them.  We understand that in some cases there may be results that are confusing or concerning to you. Not all laboratory results come back in the same time frame and the provider may be waiting for multiple results in order to interpret others.  Please give [place] 48 hours in order for your provider to thoroughly review all the results before contacting the office for clarification of your results.   _______________________________________________________  If your blood pressure at your visit was 140/90 or greater, please contact your primary care physician to follow up on this.  _______________________________________________________  If you are age 60 or older, your body mass index should be between 23-30. Your Body mass index is 28.41 kg/m. If this is out of the aforementioned range listed, please consider follow up with your Primary Care Provider.  If you are age 32 or younger, your body mass index should be between 19-25. Your Body mass index is 28.41 kg/m. If this is out of the aformentioned range listed, please  consider follow up with your Primary Care Provider.   ________________________________________________________  The [facility] providers would like to encourage you to use [facility] to communicate with providers for non-urgent requests or questions.  Due to long hold times on the telephone, sending your provider a message by [facility] may be a faster and more efficient way to get a response.  Please allow 48 business hours for a response.  Please remember that this is for non-urgent requests.  _______________________________________________________   I appreciate the  opportunity to care for you  Thank You   Jacelyn Grip

## 2023-04-01 NOTE — Progress Notes (Signed)
Chief Complaint: Abnormal CT of the abdomen  HPI:    [name] is a  62 y/o male with a past medical history as listed below including anxiety and depression, who was referred to me by Hoy Register, MD for a complaint of abnormal CT of the abdomen.    11/15/2022 patient brought to the emergency department by EMS with a complaint of feeling bloated abdominal discomfort and fecal urgency.  Apparently became anxious and checked his BP which was elevated and called 911.  At that time potassium 3.2, glucose 169, CMP and CBC otherwise normal.  CTAP with contrast done for right lower quadrant pain.  No acute findings.  Intramural fatty deposition involving the terminal ileum which is nonspecific but could be seen in the setting of chronic inflammation.  Progressive areas of bilateral peripheral groundglass and airspace densities within the posterior lung bases and to a lesser extent the right middle lobe and lingula nonspecific but could be in the setting of atypical infection including viral.  Small fat-containing umbilical hernia, unchanged lung nodules.  That time discussed possible referred to GI for colonoscopy.    02/18/2023 office visit with PCP for anxiety and depression.  At that time thought his Atorvastatin was making his bowels loose, he had been on it for a month and noticed some abdominal cramping.    Today, patient presents to clinic accompanied by his significant other.  He tells me that he was having some diarrhea but this was related to a cholesterol medicine that he was on, this has since been changed to Rosuvastatin and he has had no further diarrhea.  In fact he tells me sometimes he runs constipated with no bowel movement for 4 to 5 days.  Typically will just release in the end, he does not like to use over-the-counter products because he feels like they might interfere with other meds or does not want to have incontinence of stool.  Does have small amount of abdominal discomfort when he  is constipated.    Also describes prior issues with some indigestion and "uneasiness" in his stomach but since starting Famotidine 40 mg daily he no longer has these problems.    Denies fever, chills, weight loss or blood in his stool.    Past Medical History:  Diagnosis Date   ADHD (attention deficit hyperactivity disorder)    Anxiety    Depression    Hypertension     No past surgical history on file.  Current Outpatient Medications  Medication Sig Dispense Refill   amLODipine (NORVASC) 5 MG tablet Take 1 tablet (5 mg total) by mouth daily. 90 tablet 1   Blood Pressure Monitor DEVI Please provide patient with insurance approved blood pressure monitor 1 each 0   buPROPion (WELLBUTRIN XL) 150 MG 24 hr tablet Take 150 mg by mouth every morning.     clonazePAM (KLONOPIN) 0.5 MG tablet Take 0.25-0.5 mg by mouth 2 (two) times daily as needed for anxiety.     escitalopram (LEXAPRO) 10 MG tablet Take 1 tablet (10 mg total) by mouth daily. 90 tablet 1   famotidine (PEPCID) 40 MG tablet Take 1 tablet (40 mg total) by mouth daily. 90 tablet 1   losartan (COZAAR) 50 MG tablet Take 1 tablet (50 mg total) by mouth daily. 90 tablet 1   rosuvastatin (CRESTOR) 5 MG tablet Take 1 tablet (5 mg total) by mouth daily. 90 tablet 1   sildenafil (VIAGRA) 100 MG tablet Take 0.5-1 tablets (50-100 mg total)  by mouth daily as needed for erectile dysfunction. 5 tablet 11   No current facility-administered medications for this visit.    Allergies as of 04/01/2023   (No Known Allergies)    Family History  Problem Relation Age of Onset   Kidney disease Mother        was on dialysis at time of death   Diabetes Mother    Hypertension Mother    Hypertension Sister    Kidney disease Brother        kidney failure on dialysis   Hypertension Brother        Cause of kidney failure   Obesity Daughter    Diabetes Brother    Hyperlipidemia Brother    Hyperlipidemia Brother     Social History    Socioeconomic History   Marital status: Legally Separated    Spouse name: Not on file   Number of children: 5   Years of education: 13   Highest education level: Some college, no degree  Occupational History   Occupation: truck driver--short distance  Tobacco Use   Smoking status: Former    Current packs/day: 0.00    Types: Cigarettes    Start date: 09/21/1997    Quit date: 09/21/2002    Years since quitting: 20.5   Smokeless tobacco: Never  Vaping Use   Vaping status: Never Used  Substance and Sexual Activity   Alcohol use: Not Currently   Drug use: No   Sexual activity: Not on file  Other Topics Concern   Not on file  Social History Narrative   Originally from [place].   Difficult to get history   Has lived in [place] since 2007.   Lived in [place] since 1990   Children live in [place] or farther away   He currently lives with his girlfriend and 3 dogs.   Social Determinants of Health   Financial Resource Strain: Low Risk  (04/21/2018)   Overall Financial Resource Strain (CARDIA)    Difficulty of Paying Living Expenses: Not very hard  Food Insecurity: Food Insecurity Present (04/21/2018)   Hunger Vital Sign    Worried About Running Out of Food in the Last Year: Sometimes true    Ran Out of Food in the Last Year: Sometimes true  Transportation Needs: Unmet Transportation Needs (04/21/2018)   PRAPARE - Administrator, Civil Service (Medical): Yes    Lack of Transportation (Non-Medical): Yes  Physical Activity: Sufficiently Active (04/21/2018)   Exercise Vital Sign    Days of Exercise per Week: 7 days    Minutes of Exercise per Session: 60 min  Stress: Stress Concern Present (04/21/2018)   [employer] of Occupational Health - Occupational Stress Questionnaire    Feeling of Stress : To some extent  Social Connections: Unknown (04/21/2018)   Social Connection and Isolation Panel [NHANES]    Frequency of Communication with Friends and Family: More than  three times a week    Frequency of Social Gatherings with Friends and Family: More than three times a week    Attends Religious Services: Not on file    Active Member of Clubs or Organizations: Not on file    Attends Banker Meetings: Not on file    Marital Status: Not on file  Intimate Partner Violence: Not At Risk (03/15/2019)   Humiliation, Afraid, Rape, and Kick questionnaire    Fear of Current or Ex-Partner: No    Emotionally Abused: No    Physically Abused: No  Sexually Abused: No    Review of Systems:    Constitutional: No weight loss, fever or chills Skin: No rash  Cardiovascular: No chest pain  Respiratory: No SOB  Gastrointestinal: See HPI and otherwise negative Genitourinary: No dysuria Neurological: No headache, dizziness or syncope Musculoskeletal: No new muscle or joint pain Hematologic: No bleeding  Psychiatric: No history of depression or anxiety   Physical Exam:  Vital signs: BP 132/84   Pulse 82   Ht 6' (1.829 m)   Wt 209 lb 8 oz (95 kg)   SpO2 98%   BMI 28.41 kg/m    Constitutional:   Pleasant AA male appears to be in NAD, Well developed, Well nourished, alert and cooperative Head:  Normocephalic and atraumatic. Eyes:   PEERL, EOMI. No icterus. Conjunctiva pink. Ears:  Normal auditory acuity. Neck:  Supple Throat: Oral cavity and pharynx without inflammation, swelling or lesion.  Respiratory: Respirations even and unlabored. Lungs clear to auscultation bilaterally.   No wheezes, crackles, or rhonchi.  Cardiovascular: Normal S1, S2. No MRG. Regular rate and rhythm. No peripheral edema, cyanosis or pallor.  Gastrointestinal:  Soft, nondistended, nontender. No rebound or guarding. Normal bowel sounds. No appreciable masses or hepatomegaly. Rectal:  Not performed.  Msk:  Symmetrical without gross deformities. Without edema, no deformity or joint abnormality.  Neurologic:  Alert and  oriented x4;  grossly normal neurologically.  Skin:   Dry  and intact without significant lesions or rashes. Psychiatric: Demonstrates good judgement and reason without abnormal affect or behaviors.  RELEVANT LABS AND IMAGING: CBC    Component Value Date/Time   WBC 7.1 11/15/2022 0433   RBC 5.84 (H) 11/15/2022 0433   HGB 16.1 11/15/2022 0433   HGB 15.3 04/21/2018 1150   HCT 48.2 11/15/2022 0433   HCT 45.6 04/21/2018 1150   PLT 293 11/15/2022 0433   PLT 297 04/21/2018 1150   MCV 82.5 11/15/2022 0433   MCV 85 04/21/2018 1150   MCH 27.6 11/15/2022 0433   MCHC 33.4 11/15/2022 0433   RDW 14.1 11/15/2022 0433   RDW 14.5 04/21/2018 1150   LYMPHSABS 1.9 11/15/2022 0433   LYMPHSABS 2.1 04/21/2018 1150   MONOABS 0.7 11/15/2022 0433   EOSABS 0.2 11/15/2022 0433   EOSABS 0.2 04/21/2018 1150   BASOSABS 0.0 11/15/2022 0433   BASOSABS 0.0 04/21/2018 1150    CMP     Component Value Date/Time   NA 139 12/24/2022 1023   K 3.9 12/24/2022 1023   CL 100 12/24/2022 1023   CO2 25 12/24/2022 1023   GLUCOSE 159 (H) 12/24/2022 1023   GLUCOSE 169 (H) 11/15/2022 0433   BUN 12 12/24/2022 1023   CREATININE 1.04 12/24/2022 1023   CALCIUM 9.1 12/24/2022 1023   PROT 7.3 12/24/2022 1023   ALBUMIN 4.4 12/24/2022 1023   AST 20 12/24/2022 1023   ALT 17 12/24/2022 1023   ALKPHOS 93 12/24/2022 1023   BILITOT 0.3 12/24/2022 1023   GFRNONAA >60 11/15/2022 0433   GFRAA >60 03/10/2020 2339    Assessment: 1.  Abnormal CT of the abdomen: At the time having right lower quadrant pain and some diarrhea, question of chronic inflammation near the ileum 2.  Screening for colorectal cancer: Patient is over 50 and never had screening for colon cancer 3.  Indigestion/GERD: Better with Famotidine 40 mg daily 4.  Constipation  Plan: 1.  Scheduled patient for diagnostic colonoscopy given abnormal CT of the abdomen.  This is scheduled with Dr. Meridee Score in the [facility].  Did provide the patient a detailed list risks of procedure and he agrees to proceed. Patient is appropriate  for endoscopic procedure(s) in the ambulatory ([facility]) setting.  Patient will have 2-day bowel prep given history of constipation. 2.  Can continue Famotidine 40 mg daily for indigestion 3.  Patient to follow clinic per recommendations from Dr. Meridee Score after time of procedure.  Hyacinth Meeker, PA-C  Gastroenterology 04/01/2023, 11:43 AM  Cc: Hoy Register, MD

## 2023-06-01 ENCOUNTER — Other Ambulatory Visit: Payer: Self-pay | Admitting: Family Medicine

## 2023-06-01 DIAGNOSIS — E785 Hyperlipidemia, unspecified: Secondary | ICD-10-CM

## 2023-06-02 NOTE — Telephone Encounter (Signed)
Requested by interface surescripts. Last refill doc 05/30/23 #90  1 refill.  Requesting too soon.  Requested Prescriptions  Refused Prescriptions Disp Refills   rosuvastatin (CRESTOR) 5 MG tablet [Pharmacy Med Name: ROSUVASTATIN 5MG  TABLETS] 90 tablet 1    Sig: TAKE 1 TABLET BY MOUTH EVERY DAY     Cardiovascular:  Antilipid - Statins 2 Failed - 06/01/2023  8:04 AM      Failed - Lipid Panel in normal range within the last 12 months    Cholesterol, Total  Date Value Ref Range Status  12/24/2022 204 (H) 100 - 199 mg/dL Final   LDL Chol Calc (NIH)  Date Value Ref Range Status  12/24/2022 144 (H) 0 - 99 mg/dL Final   Direct LDL  Date Value Ref Range Status  01/11/2009 112 (H) mg/dL Final    Comment:    See lab report for associated comment(s)   HDL  Date Value Ref Range Status  12/24/2022 44 >39 mg/dL Final   Triglycerides  Date Value Ref Range Status  12/24/2022 89 0 - 149 mg/dL Final         Passed - Cr in normal range and within 360 days    Creatinine, Ser  Date Value Ref Range Status  12/24/2022 1.04 0.76 - 1.27 mg/dL Final         Passed - Patient is not pregnant      Passed - Valid encounter within last 12 months    Recent Outpatient Visits           3 months ago Anxiety and depression   [facility] [place], Odette Horns, MD   5 months ago Encounter to establish care   [facility] [place], Shea Stakes, NP       Future Appointments             In 2 months Hoy Register, MD [facility]

## 2023-06-10 ENCOUNTER — Telehealth: Payer: Self-pay | Admitting: Gastroenterology

## 2023-06-10 ENCOUNTER — Encounter: Payer: No Typology Code available for payment source | Admitting: Gastroenterology

## 2023-06-10 MED ORDER — PLENVU 140 G PO SOLR: 1.0000 | ORAL | 0 refills | Status: DC
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
## 2023-06-10 NOTE — Telephone Encounter (Signed)
Good Afternoon Dylan Burgess,  This patient rescheduled his procedure today to Aug 04, 2023 @ 2:30 pm .  Could you please send him new prep instructions to his my chart and call in Prep to pharmacy.

## 2023-06-10 NOTE — Telephone Encounter (Signed)
I have done new instructions and sent him a [facility] message that this has been done. Plenvu sent in to [facility].

## 2023-06-10 NOTE — Telephone Encounter (Signed)
Noted    GM

## 2023-06-30 ENCOUNTER — Encounter: Payer: Self-pay | Admitting: Family Medicine

## 2023-07-07 ENCOUNTER — Other Ambulatory Visit: Payer: Self-pay | Admitting: Family Medicine

## 2023-07-07 DIAGNOSIS — K219 Gastro-esophageal reflux disease without esophagitis: Secondary | ICD-10-CM

## 2023-07-07 MED ORDER — FAMOTIDINE 40 MG PO TABS: 40.0000 mg | ORAL_TABLET | Freq: Every day | ORAL | 1 refills | Status: DC

## 2023-07-18 ENCOUNTER — Emergency Department (HOSPITAL_COMMUNITY)
Admission: EM | Admit: 2023-07-18 | Discharge: 2023-07-18 | Disposition: A | Discharge status: Home / Self Care | Payer: Medicaid Other | Attending: Emergency Medicine | Admitting: Emergency Medicine

## 2023-07-18 ENCOUNTER — Other Ambulatory Visit: Payer: Self-pay

## 2023-07-18 DIAGNOSIS — S0101XA Laceration without foreign body of scalp, initial encounter: Secondary | ICD-10-CM | POA: Diagnosis not present

## 2023-07-18 DIAGNOSIS — S0993XA Unspecified injury of face, initial encounter: Secondary | ICD-10-CM | POA: Diagnosis present

## 2023-07-18 DIAGNOSIS — S0181XA Laceration without foreign body of other part of head, initial encounter: Secondary | ICD-10-CM

## 2023-07-18 DIAGNOSIS — S0083XA Contusion of other part of head, initial encounter: Secondary | ICD-10-CM

## 2023-07-18 MED ORDER — IBUPROFEN 800 MG PO TABS
800.0000 mg | ORAL_TABLET | Freq: Once | ORAL | Status: AC
  Administered 2023-07-18: 800 mg via ORAL
  Filled 2023-07-18: qty 1

## 2023-07-18 MED ORDER — LIDOCAINE-EPINEPHRINE 1 %-1:100000 IJ SOLN
20.0000 mL | Freq: Once | INTRAMUSCULAR | Status: AC
  Administered 2023-07-18: 20 mL via INTRADERMAL
  Filled 2023-07-18: qty 1

## 2023-07-18 NOTE — Discharge Instructions (Signed)
WOUND CARE ° ° Keep area clean and dry for 24 hours. Do not remove °bandage, if applied. ° After 24 hours, remove bandage and wash wound °gently with mild soap and warm water. Reapply °a new bandage after cleaning wound, if directed. ° Continue daily cleansing with soap and water until °stitches/staples are removed. ° Do not apply any ointments or creams to the wound °while stitches/staples are in place, as this may cause °delayed healing. ° Seek medical careif you experience any of the following °signs of infection: Swelling, redness, pus drainage, °streaking, fever >101.0 F ° Seek care if you experience excessive bleeding °that does not stop after 15-20 minutes of constant, firm °pressure. ° ° °
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
## 2023-07-19 ENCOUNTER — Emergency Department (HOSPITAL_COMMUNITY): Payer: Medicaid Other

## 2023-07-19 ENCOUNTER — Other Ambulatory Visit: Payer: Self-pay

## 2023-07-19 ENCOUNTER — Emergency Department (HOSPITAL_COMMUNITY)
Admission: EM | Admit: 2023-07-19 | Discharge: 2023-07-19 | Disposition: A | Discharge status: Home / Self Care | Payer: Medicaid Other | Attending: Emergency Medicine | Admitting: Emergency Medicine

## 2023-07-19 ENCOUNTER — Encounter (HOSPITAL_COMMUNITY): Payer: Self-pay | Admitting: Emergency Medicine

## 2023-07-19 DIAGNOSIS — S63601A Unspecified sprain of right thumb, initial encounter: Secondary | ICD-10-CM

## 2023-07-19 DIAGNOSIS — R110 Nausea: Secondary | ICD-10-CM | POA: Insufficient documentation

## 2023-07-19 DIAGNOSIS — R519 Headache, unspecified: Secondary | ICD-10-CM

## 2023-07-19 DIAGNOSIS — S0083XA Contusion of other part of head, initial encounter: Secondary | ICD-10-CM | POA: Diagnosis not present

## 2023-07-19 DIAGNOSIS — S0093XA Contusion of unspecified part of head, initial encounter: Secondary | ICD-10-CM

## 2023-07-19 MED ORDER — IBUPROFEN 800 MG PO TABS
800.0000 mg | ORAL_TABLET | Freq: Once | ORAL | Status: AC
  Administered 2023-07-19: 800 mg via ORAL
  Filled 2023-07-19: qty 1

## 2023-07-19 MED ORDER — IBUPROFEN 800 MG PO TABS: 800.0000 mg | ORAL_TABLET | Freq: Three times a day (TID) | ORAL | 0 refills | Status: DC | PRN

## 2023-07-19 MED ORDER — HYDROCODONE-ACETAMINOPHEN 5-325 MG PO TABS
1.0000 | ORAL_TABLET | Freq: Once | ORAL | Status: AC
  Administered 2023-07-19: 1 via ORAL
  Filled 2023-07-19: qty 1

## 2023-07-19 NOTE — ED Notes (Signed)
Pt ambulatory to bathroom
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
## 2023-07-19 NOTE — Discharge Instructions (Signed)
Return if any problems.
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
## 2023-08-04 ENCOUNTER — Encounter: Payer: No Typology Code available for payment source | Admitting: Gastroenterology

## 2023-08-17 ENCOUNTER — Encounter: Payer: Self-pay | Admitting: *Deleted

## 2023-08-17 NOTE — Congregational Nurse Program (Signed)
  Dept: [phone]   Congregational Nurse Program Note  Date of Encounter: 08/17/2023  Past Medical History: Past Medical History:  Diagnosis Date   ADHD (attention deficit hyperactivity disorder)    Anxiety    Depression    Hypertension     Encounter Details:  Community Questionnaire - 08/17/23 1603       Questionnaire   Ask client: Do you give verbal consent for me to treat you today? Yes    Student Assistance N/A    Location Patient Served  [facility]    Encounter Setting CN site;Phone/Text/Email    Population Status Unhoused    Insurance [employer]    Insurance/Financial Assistance Referral N/A    Medication Have Medication Insecurities;Provided Medication Assistance    Medical Provider Yes    Screening Referrals Made N/A    Medical Referrals Made [facility] PCP/Clinic    Medical Appointment Completed N/A    CNP Interventions Advocate/Support;Navigate Healthcare System;Case Management;Reviewed Medications    Screenings CN Performed N/A    ED Visit Averted N/A    Life-Saving Intervention Made N/A           Client came to nurse's office requesting assistance with his medication. Client reports he is out of medication and has no money at this time. Contacted [facility] and they only have one refill available for pepcid. Contacted Dr Baxter Flattery office and he has an appointment Dec 5th at [facility]. Contacted [facility] and requested medication refills sent to [facility] pharmacy. Client has medicaid. He wants to put on an account and pay later. Delitha Elms W RN CN

## 2023-08-18 ENCOUNTER — Emergency Department (HOSPITAL_COMMUNITY)
Admission: EM | Admit: 2023-08-18 | Discharge: 2023-08-19 | Disposition: A | Discharge status: Home / Self Care | Payer: Medicaid Other | Attending: Emergency Medicine | Admitting: Emergency Medicine

## 2023-08-18 DIAGNOSIS — Z5900 Homelessness unspecified: Secondary | ICD-10-CM | POA: Diagnosis not present

## 2023-08-18 DIAGNOSIS — Z79899 Other long term (current) drug therapy: Secondary | ICD-10-CM | POA: Diagnosis not present

## 2023-08-18 DIAGNOSIS — I10 Essential (primary) hypertension: Secondary | ICD-10-CM | POA: Diagnosis not present

## 2023-08-18 DIAGNOSIS — R112 Nausea with vomiting, unspecified: Secondary | ICD-10-CM | POA: Insufficient documentation

## 2023-08-18 LAB — COMPREHENSIVE METABOLIC PANEL
ALT: 29 U/L (ref 0–44)
AST: 28 U/L (ref 15–41)
Albumin: 3.6 g/dL (ref 3.5–5.0)
Alkaline Phosphatase: 75 U/L (ref 38–126)
Anion gap: 9 (ref 5–15)
BUN: 12 mg/dL (ref 6–20)
CO2: 23 mmol/L (ref 22–32)
Calcium: 8.6 mg/dL — ABNORMAL LOW (ref 8.9–10.3)
Chloride: 107 mmol/L (ref 98–111)
Creatinine, Ser: 1.05 mg/dL (ref 0.61–1.24)
GFR, Estimated: >60 mL/min (ref >60)
Glucose, Bld: 120 mg/dL — ABNORMAL HIGH (ref 70–99)
Potassium: 4 mmol/L (ref 3.5–5.1)
Sodium: 139 mmol/L (ref 135–145)
Total Bilirubin: 0.7 mg/dL (ref <1.2)
Total Protein: 6.7 g/dL (ref 6.5–8.1)

## 2023-08-18 LAB — CBC
HCT: 44.8 % (ref 39.0–52.0)
Hemoglobin: 14.5 g/dL (ref 13.0–17.0)
MCH: 27.3 pg (ref 26.0–34.0)
MCHC: 32.4 g/dL (ref 30.0–36.0)
MCV: 84.2 fL (ref 80.0–100.0)
Platelets: 282 10*3/uL (ref 150–400)
RBC: 5.32 MIL/uL (ref 4.22–5.81)
RDW: 14.4 % (ref 11.5–15.5)
WBC: 9.7 10*3/uL (ref 4.0–10.5)
nRBC: 0 % (ref 0.0–0.2)

## 2023-08-18 LAB — LIPASE, BLOOD: Lipase: 48 U/L (ref 11–51)
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
## 2023-08-19 LAB — TROPONIN I (HIGH SENSITIVITY)
Troponin I (High Sensitivity): 7 ng/L (ref <18)
Troponin I (High Sensitivity): 6 ng/L (ref <18)

## 2023-08-19 MED ORDER — ONDANSETRON HCL 4 MG PO TABS: 4.0000 mg | ORAL_TABLET | Freq: Three times a day (TID) | ORAL | 0 refills | Status: DC | PRN

## 2023-08-19 NOTE — Discharge Instructions (Addendum)
Your workup tonight was reassuring.  Please take the prescribed Zofran as directed as needed for nausea and vomiting.  If you develop life-threatening symptoms return to the emergency department.

## 2023-08-19 NOTE — ED Provider Notes (Signed)
[place] EMERGENCY DEPARTMENT AT [facility] Provider Note   CSN: [id number] Arrival date & time: 08/18/23  2231     History  Chief Complaint  Patient presents with   Nausea    Dylan Burgess is a 62 y.o. male.  Patient presents to the emergency department via EMS from shelter complaining of nausea and vomiting.  Patient states that for dinner this evening he had a "With chili".  He states that since having the chili he has vomited 2-3 times and some generalized abdominal discomfort.  He denies chest pain, shortness of breath, diarrhea, fever.  EMS administered 4 mg of Zofran and patient's nausea subsided.  He is currently complaining of feeling dehydrated.  Past medical history significant for anxiety, hypertension  HPI     Home Medications Prior to Admission medications   Medication Sig Start Date End Date Taking? Authorizing Provider  ondansetron (ZOFRAN) 4 MG tablet Take 1 tablet (4 mg total) by mouth every 8 (eight) hours as needed for nausea or vomiting. 08/19/23  Yes Barrie Dunker B, PA-C  amLODipine (NORVASC) 5 MG tablet Take 1 tablet (5 mg total) by mouth daily. 02/24/23   Hoy Register, MD  atomoxetine (STRATTERA) 40 MG capsule Take 40 mg by mouth daily. 02/26/23   [provider]  Blood Pressure Monitor DEVI Please provide patient with insurance approved blood pressure monitor 12/24/22   Claiborne Rigg, NP  buPROPion (WELLBUTRIN XL) 150 MG 24 hr tablet Take 150 mg by mouth every morning. 01/28/23   [provider]  clonazePAM (KLONOPIN) 0.5 MG tablet Take 0.25-0.5 mg by mouth 2 (two) times daily as needed for anxiety. 01/28/23   [provider]  escitalopram (LEXAPRO) 10 MG tablet Take 1 tablet (10 mg total) by mouth daily. 12/24/22   Claiborne Rigg, NP  famotidine (PEPCID) 40 MG tablet Take 1 tablet (40 mg total) by mouth daily. 07/07/23   Hoy Register, MD  ibuprofen (ADVIL) 800 MG tablet Take 1 tablet (800 mg total) by mouth  every 8 (eight) hours as needed. 07/19/23   Elson Areas, PA-C  LORazepam (ATIVAN) 0.5 MG tablet Take 0.25-0.5 mg by mouth 2 (two) times daily as needed. 06/03/23   [provider]  losartan (COZAAR) 50 MG tablet Take 1 tablet (50 mg total) by mouth daily. 02/24/23   Hoy Register, MD  PEG-KCl-NaCl-NaSulf-Na Asc-C (PLENVU) 140 g SOLR Take 1 kit by mouth as directed. 06/10/23   Zehr, Princella Pellegrini, PA-C  propranolol (INDERAL) 10 MG tablet Take 10 mg by mouth 2 (two) times daily. 06/03/23   [provider]  rosuvastatin (CRESTOR) 5 MG tablet Take 1 tablet (5 mg total) by mouth daily. 02/18/23   Hoy Register, MD  sildenafil (VIAGRA) 100 MG tablet Take 0.5-1 tablets (50-100 mg total) by mouth daily as needed for erectile dysfunction. 02/04/23   Claiborne Rigg, NP  metoprolol tartrate (LOPRESSOR) 25 MG tablet Take 1 tablet (25 mg total) by mouth 2 (two) times daily. Patient not taking: Reported on 03/15/2019 04/21/18 01/20/20  Julieanne Manson, MD      Allergies    Patient has no known allergies.    Review of Systems   Review of Systems  Physical Exam Updated Vital Signs BP (!) 151/89   Pulse 69   Temp 97.8 F (36.6 C) (Oral)   Resp 18   SpO2 97%  Physical Exam Vitals and nursing note reviewed.  Constitutional:      General: He is not  in acute distress.    Appearance: He is well-developed.  HENT:     Head: Normocephalic and atraumatic.  Eyes:     Conjunctiva/sclera: Conjunctivae normal.  Cardiovascular:     Rate and Rhythm: Normal rate and regular rhythm.  Pulmonary:     Effort: Pulmonary effort is normal. No respiratory distress.     Breath sounds: Normal breath sounds.  Abdominal:     Palpations: Abdomen is soft.     Tenderness: There is no abdominal tenderness.  Musculoskeletal:        General: No swelling.     Cervical back: Neck supple.  Skin:    General: Skin is warm and dry.     Capillary Refill: Capillary refill takes less than 2 seconds.   Neurological:     Mental Status: He is alert.  Psychiatric:        Mood and Affect: Mood normal.     ED Results / Procedures / Treatments   Labs (all labs ordered are listed, but only abnormal results are displayed) Labs Reviewed  COMPREHENSIVE METABOLIC PANEL - Abnormal; Notable for the following components:      Result Value   Glucose, Bld 120 (*)    Calcium 8.6 (*)    All other components within normal limits  LIPASE, BLOOD  CBC  URINALYSIS, ROUTINE W REFLEX MICROSCOPIC  TROPONIN I (HIGH SENSITIVITY)  TROPONIN I (HIGH SENSITIVITY)    EKG None  Radiology No results found.  Procedures Procedures    Medications Ordered in ED Medications - No data to display  ED Course/ Medical Decision Making/ A&P                                 Medical Decision Making Amount and/or Complexity of Data Reviewed Labs: ordered.   This patient presents to the ED for concern of nausea with vomiting, this involves an extensive number of treatment options, and is a complaint that carries with it a high risk of complications and morbidity.  The differential diagnosis includes gastroenteritis, appendicitis, cholecystitis, others   Co morbidities that complicate the patient evaluation  Hypertension   Additional history obtained:  Additional history obtained from EMS   Lab Tests:  I Ordered, and personally interpreted labs.  The pertinent results include: Lipase 48, unremarkable CMP, unremarkable CBC, troponin 6   Imaging Studies ordered:  The patient has no abdominal tenderness on exam.  No indication for abdominal imaging.  Nonsurgical/nonacute abdomen   Cardiac Monitoring: / EKG:  The patient was maintained on a cardiac monitor.  I personally viewed and interpreted the cardiac monitored which showed an underlying rhythm of: Sinus rhythm    Problem List / ED Course / Critical interventions / Medication management   I have reviewed the patients home medicines and  have made adjustments as needed   Social Determinants of Health:  Patient is currently homeless   Test / Admission - Considered:  Patient feeling better after Zofran administered by EMS.  Workup reassuring with no troponin elevation, nonischemic EKG, grossly unremarkable CBC, CMP, lipase.  No tenderness to suggest intra-abdominal surgical etiology.  Plan to discharge at this time with prescription for Zofran.  Return precautions provided.         Final Clinical Impression(s) / ED Diagnoses Final diagnoses:  Nausea and vomiting, unspecified vomiting type    Rx / DC Orders ED Discharge Orders  Ordered    ondansetron (ZOFRAN) 4 MG tablet  Every 8 hours PRN        08/19/23 0243              Darrick Grinder, PA-C 08/19/23 0243    Gloris Manchester, MD 08/19/23 [phone]

## 2023-08-20 ENCOUNTER — Encounter: Payer: Self-pay | Admitting: Family Medicine

## 2023-08-20 ENCOUNTER — Ambulatory Visit: Payer: Medicaid Other | Attending: Family Medicine | Admitting: Family Medicine

## 2023-08-20 ENCOUNTER — Other Ambulatory Visit: Payer: Self-pay

## 2023-08-20 VITALS — BP 148/91 | HR 68 | Ht 72.0 in | Wt 217.0 lb

## 2023-08-20 DIAGNOSIS — F32A Depression, unspecified: Secondary | ICD-10-CM

## 2023-08-20 DIAGNOSIS — I10 Essential (primary) hypertension: Secondary | ICD-10-CM

## 2023-08-20 DIAGNOSIS — R252 Cramp and spasm: Secondary | ICD-10-CM | POA: Diagnosis not present

## 2023-08-20 DIAGNOSIS — F419 Anxiety disorder, unspecified: Secondary | ICD-10-CM

## 2023-08-20 DIAGNOSIS — E785 Hyperlipidemia, unspecified: Secondary | ICD-10-CM | POA: Diagnosis not present

## 2023-08-20 MED ORDER — AMLODIPINE BESYLATE 5 MG PO TABS
5.0000 mg | ORAL_TABLET | Freq: Every day | ORAL | 1 refills | Status: DC
  Filled 2023-08-20: qty 90, 90d supply, fill #0

## 2023-08-20 MED ORDER — ROSUVASTATIN CALCIUM 5 MG PO TABS
5.0000 mg | ORAL_TABLET | Freq: Every day | ORAL | 1 refills | Status: DC
  Filled 2023-08-20: qty 90, 90d supply, fill #0

## 2023-08-20 MED ORDER — LOSARTAN POTASSIUM 50 MG PO TABS
50.0000 mg | ORAL_TABLET | Freq: Every day | ORAL | 1 refills | Status: DC
  Filled 2023-08-20: qty 90, 90d supply, fill #0

## 2023-08-20 NOTE — Progress Notes (Signed)
Subjective:  Patient ID: Dylan Burgess, male    DOB: [birth year]  Age: 62 y.o. MRN: [id number]  CC: Medical Management of Chronic Issues (Wants medications switched to [facility])   HPI Dylan Burgess is a 62 y.o. year old male with a history of Anxiety and Depression, HTN, GERD. [facility]. Anxiety and depression is managed by Dr Jefferson Fuel at [facility].  Interval History: Discussed the use of AI scribe software for clinical note transcription with the patient, who gave verbal consent to proceed.  He has been unable to afford his medications, amlodipine 5mg  and losartan 50mg , for the past 2-3 weeks due to financial hardship. He is currently living in a shelter and awaiting disability benefits. He has transferred his prescriptions to the clinic pharmacy in order to obtain his medications.  In addition to his hypertension and hyperlipidemia medications, he is also trying to obtain his psychiatric medications from his psychiatrist, Dr. Leighton Roach. He has been without these medications and is due to see Dr. Leighton Roach on the 11th. He is also seeing a therapist, Fredrik Cove.  The patient has been dealing with the recent death of his partner and is experiencing depression. He is interested in grief counseling and is currently receiving services through [facility].  The patient has also been experiencing leg cramps, which he attributes to increased walking due to his current living situation. He has inquired about potassium supplements for this issue.        Past Medical History:  Diagnosis Date   ADHD (attention deficit hyperactivity disorder)    Anxiety    Depression    Hypertension     No past surgical history on file.  Family History  Problem Relation Age of Onset   Kidney disease Mother        was on dialysis at time of death   Diabetes Mother    Hypertension Mother    Hypertension Sister    Kidney disease Brother         kidney failure on dialysis   Hypertension Brother        Cause of kidney failure   Diabetes Brother    Hyperlipidemia Brother    Hyperlipidemia Brother    Obesity Daughter    Colon cancer Neg Hx    Rectal cancer Neg Hx    Stomach cancer Neg Hx    Esophageal cancer Neg Hx    Pancreatic cancer Neg Hx    Liver cancer Neg Hx     Social History   Socioeconomic History   Marital status: Legally Separated    Spouse name: Not on file   Number of children: 5   Years of education: 13   Highest education level: Some college, no degree  Occupational History   Occupation: truck driver--short distance  Tobacco Use   Smoking status: Former    Current packs/day: 0.00    Types: Cigarettes    Start date: 09/21/1997    Quit date: 09/21/2002    Years since quitting: 20.9   Smokeless tobacco: Never  Vaping Use   Vaping status: Never Used  Substance and Sexual Activity   Alcohol use: Not Currently   Drug use: No   Sexual activity: Not on file  Other Topics Concern   Not on file  Social History Narrative   Originally from [place].   Difficult to get history   Has lived in [place] since 2007.   Lived in [place] since 1990  Children live in [place] or farther away   He currently lives with his girlfriend and 3 dogs.   Social Determinants of Health   Financial Resource Strain: Low Risk  (04/21/2018)   Overall Financial Resource Strain (CARDIA)    Difficulty of Paying Living Expenses: Not very hard  Food Insecurity: Food Insecurity Present (04/21/2018)   Hunger Vital Sign    Worried About Running Out of Food in the Last Year: Sometimes true    Ran Out of Food in the Last Year: Sometimes true  Transportation Needs: Unmet Transportation Needs (04/21/2018)   PRAPARE - Administrator, Civil Service (Medical): Yes    Lack of Transportation (Non-Medical): Yes  Physical Activity: Sufficiently Active (04/21/2018)   Exercise Vital Sign    Days of Exercise per Week: 7 days    Minutes  of Exercise per Session: 60 min  Stress: Stress Concern Present (04/21/2018)   [employer] of Occupational Health - Occupational Stress Questionnaire    Feeling of Stress : To some extent  Social Connections: Unknown (04/21/2018)   Social Connection and Isolation Panel [NHANES]    Frequency of Communication with Friends and Family: More than three times a week    Frequency of Social Gatherings with Friends and Family: More than three times a week    Attends Religious Services: Not on Marketing executive or Organizations: Not on file    Attends Banker Meetings: Not on file    Marital Status: Not on file    No Known Allergies  Outpatient Medications Prior to Visit  Medication Sig Dispense Refill   atomoxetine (STRATTERA) 40 MG capsule Take 40 mg by mouth daily.     Blood Pressure Monitor DEVI Please provide patient with insurance approved blood pressure monitor 1 each 0   buPROPion (WELLBUTRIN XL) 150 MG 24 hr tablet Take 150 mg by mouth every morning.     clonazePAM (KLONOPIN) 0.5 MG tablet Take 0.25-0.5 mg by mouth 2 (two) times daily as needed for anxiety.     escitalopram (LEXAPRO) 10 MG tablet Take 1 tablet (10 mg total) by mouth daily. 90 tablet 1   famotidine (PEPCID) 40 MG tablet Take 1 tablet (40 mg total) by mouth daily. 90 tablet 1   ibuprofen (ADVIL) 800 MG tablet Take 1 tablet (800 mg total) by mouth every 8 (eight) hours as needed. 30 tablet 0   LORazepam (ATIVAN) 0.5 MG tablet Take 0.25-0.5 mg by mouth 2 (two) times daily as needed.     ondansetron (ZOFRAN) 4 MG tablet Take 1 tablet (4 mg total) by mouth every 8 (eight) hours as needed for nausea or vomiting. 20 tablet 0   PEG-KCl-NaCl-NaSulf-Na Asc-C (PLENVU) 140 g SOLR Take 1 kit by mouth as directed. 1 each 0   propranolol (INDERAL) 10 MG tablet Take 10 mg by mouth 2 (two) times daily.     sildenafil (VIAGRA) 100 MG tablet Take 0.5-1 tablets (50-100 mg total) by mouth daily as needed for  erectile dysfunction. 5 tablet 11   amLODipine (NORVASC) 5 MG tablet Take 1 tablet (5 mg total) by mouth daily. 90 tablet 1   losartan (COZAAR) 50 MG tablet Take 1 tablet (50 mg total) by mouth daily. 90 tablet 1   rosuvastatin (CRESTOR) 5 MG tablet Take 1 tablet (5 mg total) by mouth daily. 90 tablet 1   No facility-administered medications prior to visit.     ROS Review of Systems  Constitutional:  Negative for activity change and appetite change.  HENT:  Negative for sinus pressure and sore throat.   Respiratory:  Negative for chest tightness, shortness of breath and wheezing.   Cardiovascular:  Negative for chest pain and palpitations.  Gastrointestinal:  Negative for abdominal distention, abdominal pain and constipation.  Genitourinary: Negative.   Musculoskeletal: Negative.   Psychiatric/Behavioral:  Positive for dysphoric mood. Negative for behavioral problems.     Objective:  BP (!) 148/91   Pulse 68   Ht 6' (1.829 m)   Wt 217 lb (98.4 kg)   SpO2 97%   BMI 29.43 kg/m      08/20/2023   11:11 AM 08/19/2023    2:55 AM 08/19/2023   12:15 AM  BP/Weight  Systolic BP 148 128 151  Diastolic BP 91 82 89  Wt. (Lbs) 217    BMI 29.43 kg/m2        Physical Exam Constitutional:      Appearance: He is well-developed.  Cardiovascular:     Rate and Rhythm: Normal rate.     Heart sounds: Normal heart sounds. No murmur heard. Pulmonary:     Effort: Pulmonary effort is normal.     Breath sounds: Normal breath sounds. No wheezing or rales.  Chest:     Chest wall: No tenderness.  Abdominal:     General: Bowel sounds are normal. There is no distension.     Palpations: Abdomen is soft. There is no mass.     Tenderness: There is no abdominal tenderness.  Musculoskeletal:        General: Normal range of motion.     Right lower leg: No edema.     Left lower leg: No edema.  Neurological:     Mental Status: He is alert and oriented to person, place, and time.  Psychiatric:      Comments: Dysphoric mood        Latest Ref Rng & Units 08/18/2023   10:36 PM 12/24/2022   10:23 AM 11/15/2022    4:33 AM  CMP  Glucose 70 - 99 mg/dL 409  811  914   BUN 6 - 20 mg/dL 12  12  13    Creatinine 0.61 - 1.24 mg/dL 7.82  9.56  2.13   Sodium 135 - 145 mmol/L 139  139  137   Potassium 3.5 - 5.1 mmol/L 4.0  3.9  3.2   Chloride 98 - 111 mmol/L 107  100  102   CO2 22 - 32 mmol/L 23  25  24    Calcium 8.9 - 10.3 mg/dL 8.6  9.1  8.8   Total Protein 6.5 - 8.1 g/dL 6.7  7.3  7.5   Total Bilirubin <1.2 mg/dL 0.7  0.3  0.5   Alkaline Phos 38 - 126 U/L 75  93  76   AST 15 - 41 U/L 28  20  23    ALT 0 - 44 U/L 29  17  22      Lipid Panel     Component Value Date/Time   CHOL 204 (H) 12/24/2022 1023   TRIG 89 12/24/2022 1023   HDL 44 12/24/2022 1023   CHOLHDL 4.6 12/24/2022 1023   LDLCALC 144 (H) 12/24/2022 1023   LDLDIRECT 112 (H) 01/11/2009 2207    CBC    Component Value Date/Time   WBC 9.7 08/18/2023 2236   RBC 5.32 08/18/2023 2236   HGB 14.5 08/18/2023 2236   HGB 15.3 04/21/2018 1150   HCT 44.8  08/18/2023 2236   HCT 45.6 04/21/2018 1150   PLT 282 08/18/2023 2236   PLT 297 04/21/2018 1150   MCV 84.2 08/18/2023 2236   MCV 85 04/21/2018 1150   MCH 27.3 08/18/2023 2236   MCHC 32.4 08/18/2023 2236   RDW 14.4 08/18/2023 2236   RDW 14.5 04/21/2018 1150   LYMPHSABS 1.9 11/15/2022 0433   LYMPHSABS 2.1 04/21/2018 1150   MONOABS 0.7 11/15/2022 0433   EOSABS 0.2 11/15/2022 0433   EOSABS 0.2 04/21/2018 1150   BASOSABS 0.0 11/15/2022 0433   BASOSABS 0.0 04/21/2018 1150    No results found for: "HGBA1C"  Assessment & Plan:      Hypertension Patient has been off Amlodipine 5mg  and Losartan 50mg  for 2-3 weeks due to financial constraints. Blood pressure control prior to this was good. -Resume Amlodipine 5mg  and Losartan 50mg , prescriptions to be filled at the clinic pharmacy.  Hyperlipidemia Patient has been off Crestor due to financial constraints. -Resume Crestor,  prescription to be filled at the clinic pharmacy.  Anxiety and depression Patient has been off psychiatric medication due to financial constraints and is awaiting a refill from Dr. Leighton Roach. -Advise patient to reach out to [facility], to expedite contact with Dr. Leighton Roach.  Colon Cancer Screening Patient has a pending colonoscopy which has been delayed due to personal circumstances. -Reschedule colonoscopy when patient's situation stabilizes.  Muscle Cramps Patient reports frequent muscle cramps, likely due to increased walking. Potassium levels are normal. -No additional intervention at this time as patient declined muscle relaxant due to potential side effect of drowsiness.  -Schedule follow-up appointment in 6 months.          Meds ordered this encounter  Medications   amLODipine (NORVASC) 5 MG tablet    Sig: Take 1 tablet (5 mg total) by mouth daily.    Dispense:  90 tablet    Refill:  1   losartan (COZAAR) 50 MG tablet    Sig: Take 1 tablet (50 mg total) by mouth daily.    Dispense:  90 tablet    Refill:  1    Discontinue lisinopril/HCTZ   rosuvastatin (CRESTOR) 5 MG tablet    Sig: Take 1 tablet (5 mg total) by mouth daily.    Dispense:  90 tablet    Refill:  1    Intolerance to atorvastatin (diarrhea)    Follow-up: Return in about 6 months (around 02/18/2024) for Chronic medical conditions.       Hoy Register, MD, FAAFP. [facility] [place], [place] [phone]   08/20/2023, 12:56 PM

## 2023-08-20 NOTE — Patient Instructions (Addendum)
VISIT SUMMARY:  During today's visit, we discussed your current health concerns and medication needs. We addressed your hypertension, hyperlipidemia, psychiatric medication, colon cancer screening, and muscle cramps. We also made plans to help you manage these issues moving forward.  YOUR PLAN:  -HYPERTENSION: Hypertension, or high blood pressure, is a condition where the force of the blood against your artery walls is too high. You will resume taking Amlodipine 5mg  and Losartan 50mg , with prescriptions filled at the clinic pharmacy.  -HYPERLIPIDEMIA: Hyperlipidemia is a condition where there are high levels of fats (lipids) in your blood. You will resume taking Crestor, with the prescription filled at the clinic pharmacy.  -ANXIETY AND DEPRESSION: You have been off your psychiatric medications due to financial constraints. Please reach out to [facility], your conversation therapist, to expedite contact with Dr. Leighton Roach for your medication refills.  -COLON CANCER SCREENING: Colon cancer screening is a test to check for cancer in the colon. Your colonoscopy has been delayed, and you should reschedule it when your situation stabilizes.  -MUSCLE CRAMPS: Muscle cramps are sudden, involuntary contractions of one or more muscles. Your cramps are likely due to increased walking, and your potassium levels are normal. No additional intervention is needed at this time as you declined muscle relaxants.  INSTRUCTIONS:  Please schedule a follow-up appointment in 6 months.

## 2023-08-21 ENCOUNTER — Other Ambulatory Visit: Payer: Self-pay

## 2023-08-21 MED ORDER — ATOMOXETINE HCL 40 MG PO CAPS
40.0000 mg | ORAL_CAPSULE | Freq: Every day | ORAL | 0 refills | Status: DC
  Filled 2023-08-21: qty 90, 90d supply, fill #0

## 2023-08-21 MED ORDER — BUPROPION HCL ER (XL) 150 MG PO TB24
150.0000 mg | ORAL_TABLET | Freq: Every morning | ORAL | 0 refills | Status: DC
  Filled 2023-08-21: qty 90, 90d supply, fill #0

## 2023-08-21 MED ORDER — PROPRANOLOL HCL 10 MG PO TABS
10.0000 mg | ORAL_TABLET | Freq: Two times a day (BID) | ORAL | 0 refills | Status: DC
  Filled 2023-08-21: qty 60, 30d supply, fill #0

## 2023-08-24 ENCOUNTER — Other Ambulatory Visit: Payer: Self-pay

## 2023-08-24 MED ORDER — LORAZEPAM 0.5 MG PO TABS
0.2500 mg | ORAL_TABLET | Freq: Two times a day (BID) | ORAL | 0 refills | Status: DC | PRN
  Filled 2023-08-24: qty 30, 30d supply, fill #0

## 2023-08-26 ENCOUNTER — Other Ambulatory Visit: Payer: Self-pay

## 2023-08-26 MED ORDER — PROPRANOLOL HCL 10 MG PO TABS: 10.0000 mg | ORAL_TABLET | Freq: Two times a day (BID) | ORAL | 2 refills | Status: DC

## 2023-08-26 MED ORDER — BUPROPION HCL ER (XL) 150 MG PO TB24: 150.0000 mg | ORAL_TABLET | Freq: Every morning | ORAL | 0 refills | Status: DC

## 2023-08-26 MED ORDER — ATOMOXETINE HCL 40 MG PO CAPS: 40.0000 mg | ORAL_CAPSULE | Freq: Every day | ORAL | 0 refills | Status: AC

## 2023-08-27 ENCOUNTER — Other Ambulatory Visit: Payer: Self-pay

## 2023-09-14 ENCOUNTER — Other Ambulatory Visit: Payer: Self-pay

## 2023-09-19 ENCOUNTER — Emergency Department (HOSPITAL_COMMUNITY)
Admission: EM | Admit: 2023-09-19 | Discharge: 2023-09-19 | Disposition: A | Discharge status: Home / Self Care | Payer: Medicaid Other | Attending: Emergency Medicine | Admitting: Emergency Medicine

## 2023-09-19 ENCOUNTER — Encounter (HOSPITAL_COMMUNITY): Payer: Self-pay | Admitting: Emergency Medicine

## 2023-09-19 ENCOUNTER — Other Ambulatory Visit: Payer: Self-pay

## 2023-09-19 DIAGNOSIS — K529 Noninfective gastroenteritis and colitis, unspecified: Secondary | ICD-10-CM | POA: Insufficient documentation

## 2023-09-19 DIAGNOSIS — Z79899 Other long term (current) drug therapy: Secondary | ICD-10-CM | POA: Diagnosis not present

## 2023-09-19 DIAGNOSIS — R112 Nausea with vomiting, unspecified: Secondary | ICD-10-CM | POA: Diagnosis present

## 2023-09-19 DIAGNOSIS — I10 Essential (primary) hypertension: Secondary | ICD-10-CM | POA: Insufficient documentation

## 2023-09-19 LAB — COMPREHENSIVE METABOLIC PANEL
ALT: 19 U/L (ref 0–44)
AST: 18 U/L (ref 15–41)
Albumin: 3.9 g/dL (ref 3.5–5.0)
Alkaline Phosphatase: 63 U/L (ref 38–126)
Anion gap: 10 (ref 5–15)
BUN: 13 mg/dL (ref 6–20)
CO2: 21 mmol/L — ABNORMAL LOW (ref 22–32)
Calcium: 8.5 mg/dL — ABNORMAL LOW (ref 8.9–10.3)
Chloride: 104 mmol/L (ref 98–111)
Creatinine, Ser: 0.88 mg/dL (ref 0.61–1.24)
GFR, Estimated: >60 mL/min (ref >60)
Glucose, Bld: 125 mg/dL — ABNORMAL HIGH (ref 70–99)
Potassium: 3.5 mmol/L (ref 3.5–5.1)
Sodium: 135 mmol/L (ref 135–145)
Total Bilirubin: 0.7 mg/dL (ref 0.0–1.2)
Total Protein: 7.5 g/dL (ref 6.5–8.1)

## 2023-09-19 LAB — CBC WITH DIFFERENTIAL/PLATELET
Abs Immature Granulocytes: 0.03 10*3/uL (ref 0.00–0.07)
Basophils Absolute: 0 10*3/uL (ref 0.0–0.1)
Basophils Relative: 0 %
Eosinophils Absolute: 0.2 10*3/uL (ref 0.0–0.5)
Eosinophils Relative: 2 %
HCT: 47.4 % (ref 39.0–52.0)
Hemoglobin: 15.4 g/dL (ref 13.0–17.0)
Immature Granulocytes: 0 %
Lymphocytes Relative: 9 %
Lymphs Abs: 0.9 10*3/uL (ref 0.7–4.0)
MCH: 28.2 pg (ref 26.0–34.0)
MCHC: 32.5 g/dL (ref 30.0–36.0)
MCV: 86.7 fL (ref 80.0–100.0)
Monocytes Absolute: 0.8 10*3/uL (ref 0.1–1.0)
Monocytes Relative: 9 %
Neutro Abs: 7.7 10*3/uL (ref 1.7–7.7)
Neutrophils Relative %: 80 %
Platelets: 263 10*3/uL (ref 150–400)
RBC: 5.47 MIL/uL (ref 4.22–5.81)
RDW: 14 % (ref 11.5–15.5)
WBC: 9.6 10*3/uL (ref 4.0–10.5)
nRBC: 0 % (ref 0.0–0.2)

## 2023-09-19 LAB — LIPASE, BLOOD: Lipase: 38 U/L (ref 11–51)

## 2023-09-19 MED ORDER — ONDANSETRON 4 MG PO TBDP
4.0000 mg | ORAL_TABLET | Freq: Three times a day (TID) | ORAL | 0 refills | Status: DC | PRN
  Filled 2023-09-19: qty 20, 7d supply, fill #0

## 2023-09-19 MED ORDER — ONDANSETRON 4 MG PO TBDP: 4.0000 mg | ORAL_TABLET | Freq: Three times a day (TID) | ORAL | 0 refills | Status: DC | PRN

## 2023-09-19 MED ORDER — METOCLOPRAMIDE HCL 5 MG/ML IJ SOLN
10.0000 mg | Freq: Once | INTRAMUSCULAR | Status: AC
  Administered 2023-09-19: 10 mg via INTRAVENOUS
  Filled 2023-09-19: qty 2

## 2023-09-19 MED ORDER — SODIUM CHLORIDE 0.9 % IV BOLUS
500.0000 mL | Freq: Once | INTRAVENOUS | Status: AC
  Administered 2023-09-19: 500 mL via INTRAVENOUS

## 2023-09-19 NOTE — ED Notes (Signed)
 Patient was able to tolerate water well. States that he does not feel like it is coming up.

## 2023-09-19 NOTE — ED Triage Notes (Addendum)
 PT from [facility] reporting nausea and vomiting and diarrhea. Generalized pain. Endorses bloating and headache and dehydration. EMS gave zofran

## 2023-09-19 NOTE — ED Provider Notes (Signed)
 [facility] EMERGENCY DEPARTMENT AT [facility] Provider Note   CSN: [id number] Arrival date & time: 09/19/23  0103     History  Chief Complaint  Patient presents with   Abdominal Pain    Dylan Burgess is a 63 y.o. male past medical history of hypertension, depression, anxiety reporting to emergency room with 2 days of nausea vomiting diarrhea and abdominal cramping.  Patient reports that there is a virus going around his living facility.  Reports no other sick contact.  He has not been able to eat anything approximately 24 hours.  Reports some improvement after Zofran  however he is still unable to drink water or keep anything down.  Has had no episodes of vomiting or diarrhea since arriving to emergency room.  Reports his vomit has been nonbilious, none bloody.  Reports 2-3 loose stools, nonbloody.  Generalized, intermittent abdominal cramping. Denies any chest pain, fevers chills, shortness of breath or cough.   Abdominal Pain      Home Medications Prior to Admission medications   Medication Sig Start Date End Date Taking? Authorizing Provider  amLODipine  (NORVASC ) 5 MG tablet Take 1 tablet (5 mg total) by mouth daily. 08/20/23   Newlin, Enobong, MD  atomoxetine  (STRATTERA ) 40 MG capsule Take 40 mg by mouth daily. 02/26/23   [provider]  atomoxetine  (STRATTERA ) 40 MG capsule Take 1 capsule (40 mg total) by mouth daily. 08/26/23     Blood Pressure Monitor DEVI Please provide patient with insurance approved blood pressure monitor 12/24/22   Theotis Haze ORN, NP  buPROPion  (WELLBUTRIN  XL) 150 MG 24 hr tablet Take 150 mg by mouth every morning. 01/28/23   [provider]  buPROPion  (WELLBUTRIN  XL) 150 MG 24 hr tablet Take 1 tablet by mouth every morning 08/26/23     clonazePAM (KLONOPIN) 0.5 MG tablet Take 0.25-0.5 mg by mouth 2 (two) times daily as needed for anxiety. 01/28/23   [provider]  escitalopram  (LEXAPRO ) 10 MG tablet Take 1 tablet  (10 mg total) by mouth daily. 12/24/22   Fleming, Zelda W, NP  famotidine  (PEPCID ) 40 MG tablet Take 1 tablet (40 mg total) by mouth daily. 07/07/23   Newlin, Enobong, MD  ibuprofen  (ADVIL ) 800 MG tablet Take 1 tablet (800 mg total) by mouth every 8 (eight) hours as needed. 07/19/23   Sofia, Leslie K, PA-C  LORazepam  (ATIVAN ) 0.5 MG tablet Take 0.25-0.5 mg by mouth 2 (two) times daily as needed. 06/03/23   [provider]  LORazepam  (ATIVAN ) 0.5 MG tablet Take 0.5-1 tablets (0.25-0.5 mg total) by mouth 2 (two) times daily as needed. (max 1 tab daily) 08/22/23     losartan  (COZAAR ) 50 MG tablet Take 1 tablet (50 mg total) by mouth daily. 08/20/23   Newlin, Enobong, MD  ondansetron  (ZOFRAN ) 4 MG tablet Take 1 tablet (4 mg total) by mouth every 8 (eight) hours as needed for nausea or vomiting. 08/19/23   Logan Ubaldo NOVAK, PA-C  PEG-KCl-NaCl-NaSulf-Na Asc-C (PLENVU ) 140 g SOLR Take 1 kit by mouth as directed. 06/10/23   Zehr, Jessica D, PA-C  propranolol  (INDERAL ) 10 MG tablet Take 10 mg by mouth 2 (two) times daily. 06/03/23   [provider]  propranolol  (INDERAL ) 10 MG tablet Take 1 tablet (10 mg total) by mouth 2 (two) times daily. 08/26/23     rosuvastatin  (CRESTOR ) 5 MG tablet Take 1 tablet (5 mg total) by mouth daily. 08/20/23   Newlin, Enobong, MD  sildenafil  (VIAGRA ) 100 MG tablet Take  0.5-1 tablets (50-100 mg total) by mouth daily as needed for erectile dysfunction. 02/04/23   Fleming, Zelda W, NP  metoprolol  tartrate (LOPRESSOR ) 25 MG tablet Take 1 tablet (25 mg total) by mouth 2 (two) times daily. Patient not taking: Reported on 03/15/2019 04/21/18 01/20/20  Adella Norris, MD      Allergies    Patient has no known allergies.    Review of Systems   Review of Systems  Gastrointestinal:  Positive for abdominal pain.    Physical Exam Updated Vital Signs BP 137/75 (BP Location: Right Arm)   Pulse 75   Temp 98.9 F (37.2 C) (Oral)   Resp 17   SpO2 93%  Physical  Exam Vitals and nursing note reviewed.  Constitutional:      General: He is not in acute distress.    Appearance: He is not ill-appearing, toxic-appearing or diaphoretic.  HENT:     Head: Normocephalic and atraumatic.  Eyes:     General: No scleral icterus.    Conjunctiva/sclera: Conjunctivae normal.  Cardiovascular:     Rate and Rhythm: Normal rate and regular rhythm.     Pulses: Normal pulses.     Heart sounds: Normal heart sounds.  Pulmonary:     Effort: Pulmonary effort is normal. No respiratory distress.     Breath sounds: Normal breath sounds.  Abdominal:     General: Abdomen is flat. Bowel sounds are normal.     Palpations: Abdomen is soft.     Tenderness: There is no abdominal tenderness.  Musculoskeletal:     Right lower leg: No edema.     Left lower leg: No edema.  Skin:    General: Skin is warm and dry.     Findings: No lesion.  Neurological:     General: No focal deficit present.     Mental Status: He is alert and oriented to person, place, and time. Mental status is at baseline.     ED Results / Procedures / Treatments   Labs (all labs ordered are listed, but only abnormal results are displayed) Labs Reviewed  COMPREHENSIVE METABOLIC PANEL - Abnormal; Notable for the following components:      Result Value   CO2 21 (*)    Glucose, Bld 125 (*)    Calcium  8.5 (*)    All other components within normal limits  CBC WITH DIFFERENTIAL/PLATELET  LIPASE, BLOOD    EKG None  Radiology No results found.  Procedures Procedures    Medications Ordered in ED Medications  sodium chloride  0.9 % bolus 500 mL (has no administration in time range)  metoCLOPramide  (REGLAN ) injection 10 mg (has no administration in time range)    ED Course/ Medical Decision Making/ A&P                                 Medical Decision Making Amount and/or Complexity of Data Reviewed Labs: ordered.  Risk Prescription drug management.   Dylan Burgess 63 y.o. presented  today for abd pain. Working DDx includes, but not limited to, gastroenteritis, colitis, SBO, appendicitis, cholecystitis, hepatobiliary pathology, gastritis, PUD, ACS, dissection, pancreatitis, nephrolithiasis, AAA, UTI, pyelonephritis, testicular torsion.  R/o DDx: These are considered less likely than current impression due to history of present illness, physical exam, labs/imaging findings.  Review of prior external notes: 08/19/23 for similar thing   Pmhx: depression, anxiety, hypertension  Unique Tests and My Interpretation:  CBC with differential: No leukocytosis,  no anemia CMP: No obvious electrolyte abnormality, no AKI normal anion gap Lipase: 38   Imaging:  No focal area of tenderness on exam, soft, non distended   Problem List / ED Course / Critical interventions / Medication management  Patient reporting to emergency room with nausea vomiting abdominal cramping and diarrhea.  Symptoms consistent with a gastroenteritis.  Patient is hemodynamically stable and well-appearing.  No focal area of tenderness on exam suggesting imaging is necessary at this time.  Labs are reassuring with no significant electrolyte abnormality, no anion gap and no leukocytosis.  Patient's vitals are stable with no fever and no tachycardia.  Patient appears well-hydrated however not originally tolerating p.o. so we will supplement with normal saline and Reglan  and then reassess. I ordered medication including Reglan , NS Will PO challenge. Patient tolerating PO in room, feeling better.  Reevaluation of the patient after these medicines showed that the patient improved Patients vitals assessed. Upon arrival patient is hemodynamically stable.  I have reviewed the patients home medicines and have made adjustments as needed   Consult: None  Plan:  F/u w/ PCP in 2-3d to ensure resolution of sx.  Patient was given return precautions. Patient stable for discharge at this time.  Patient educated on current  sx/dx and verbalized understanding of plan. Return to ER w/ new or worsening sx.          Final Clinical Impression(s) / ED Diagnoses Final diagnoses:  Gastroenteritis    Rx / DC Orders ED Discharge Orders          Ordered    ondansetron  (ZOFRAN -ODT) 4 MG disintegrating tablet  Every 8 hours PRN,   Status:  Discontinued        09/19/23 1013    ondansetron  (ZOFRAN -ODT) 4 MG disintegrating tablet  Every 8 hours PRN        09/19/23 1046              Belvin Gauss, Warren SAILOR, PA-C 09/19/23 1104    Dean Clarity, MD 09/19/23 1529

## 2023-09-19 NOTE — Discharge Instructions (Addendum)
 You were seen in the emergency room today for abdominal pain.  I have sent Zofran  to take as needed to pharmacy.  Make sure you are trying bland foods like toast, banana, applesauce.  Make sure you are staying well-hydrated want you can alternate Pedialyte and Gatorade as needed.  If you experience diarrhea you can take Imodium as needed.  Please return to emergency room with new or worsening symptoms.

## 2023-09-23 ENCOUNTER — Other Ambulatory Visit: Payer: Self-pay | Admitting: Physician Assistant

## 2023-09-23 ENCOUNTER — Encounter: Payer: Self-pay | Admitting: Physician Assistant

## 2023-09-23 ENCOUNTER — Other Ambulatory Visit: Payer: Self-pay

## 2023-09-23 DIAGNOSIS — K219 Gastro-esophageal reflux disease without esophagitis: Secondary | ICD-10-CM

## 2023-09-23 MED ORDER — FAMOTIDINE 40 MG PO TABS
40.0000 mg | ORAL_TABLET | Freq: Every day | ORAL | 1 refills | Status: DC
  Filled 2023-09-23: qty 45, 45d supply, fill #0
  Filled 2023-09-23: qty 90, 90d supply, fill #0

## 2023-09-23 NOTE — Progress Notes (Signed)
 See note

## 2023-09-23 NOTE — Progress Notes (Signed)
 He is out of the med he used to take for his stomach. Upon med review, it was Pepcid  40 mg every day. Has not had this in months.   Was seen by Dr Delbert 12/05, she was supposed to refill the Pepcid  but did not. Dr Brien works w/ her, so said ok to refill it >> done.   Does not take ibuprofen  much now.  He has upper abd pain and feels his stomach bubble and gurgle.   He has abd pain at times.   He has nausea at times.  No prob w/ BMs in general, but recently had Norovirus.  He was seen in the ER 01/05 because of this.    Shona Shad, PA-C 09/23/2023 4:09 PM

## 2023-09-24 ENCOUNTER — Other Ambulatory Visit (HOSPITAL_COMMUNITY): Payer: Self-pay

## 2023-09-24 ENCOUNTER — Other Ambulatory Visit: Payer: Self-pay

## 2023-09-25 ENCOUNTER — Other Ambulatory Visit: Payer: Self-pay

## 2023-09-25 ENCOUNTER — Emergency Department (HOSPITAL_COMMUNITY): Payer: Medicaid Other

## 2023-09-25 ENCOUNTER — Emergency Department (HOSPITAL_COMMUNITY)
Admission: EM | Admit: 2023-09-25 | Discharge: 2023-09-25 | Disposition: A | Discharge status: Home / Self Care | Payer: Medicaid Other

## 2023-09-25 ENCOUNTER — Encounter (HOSPITAL_COMMUNITY): Payer: Self-pay

## 2023-09-25 DIAGNOSIS — J09X2 Influenza due to identified novel influenza A virus with other respiratory manifestations: Secondary | ICD-10-CM | POA: Insufficient documentation

## 2023-09-25 DIAGNOSIS — R059 Cough, unspecified: Secondary | ICD-10-CM | POA: Diagnosis present

## 2023-09-25 DIAGNOSIS — J111 Influenza due to unidentified influenza virus with other respiratory manifestations: Secondary | ICD-10-CM

## 2023-09-25 DIAGNOSIS — Z20822 Contact with and (suspected) exposure to covid-19: Secondary | ICD-10-CM | POA: Insufficient documentation

## 2023-09-25 LAB — CBC
HCT: 43.4 % (ref 39.0–52.0)
Hemoglobin: 14.5 g/dL (ref 13.0–17.0)
MCH: 27.6 pg (ref 26.0–34.0)
MCHC: 33.4 g/dL (ref 30.0–36.0)
MCV: 82.7 fL (ref 80.0–100.0)
Platelets: 274 10*3/uL (ref 150–400)
RBC: 5.25 MIL/uL (ref 4.22–5.81)
RDW: 13.4 % (ref 11.5–15.5)
WBC: 10.9 10*3/uL — ABNORMAL HIGH (ref 4.0–10.5)
nRBC: 0 % (ref 0.0–0.2)

## 2023-09-25 LAB — COMPREHENSIVE METABOLIC PANEL
ALT: 28 U/L (ref 0–44)
AST: 31 U/L (ref 15–41)
Albumin: 3.5 g/dL (ref 3.5–5.0)
Alkaline Phosphatase: 56 U/L (ref 38–126)
Anion gap: 8 (ref 5–15)
BUN: 7 mg/dL (ref 6–20)
CO2: 26 mmol/L (ref 22–32)
Calcium: 8.3 mg/dL — ABNORMAL LOW (ref 8.9–10.3)
Chloride: 104 mmol/L (ref 98–111)
Creatinine, Ser: 1.08 mg/dL (ref 0.61–1.24)
GFR, Estimated: >60 mL/min (ref >60)
Glucose, Bld: 110 mg/dL — ABNORMAL HIGH (ref 70–99)
Potassium: 3.8 mmol/L (ref 3.5–5.1)
Sodium: 138 mmol/L (ref 135–145)
Total Bilirubin: 1.1 mg/dL (ref 0.0–1.2)
Total Protein: 6.7 g/dL (ref 6.5–8.1)

## 2023-09-25 LAB — RESP PANEL BY RT-PCR (RSV, FLU A&B, COVID)  RVPGX2
Influenza A by PCR: POSITIVE — AB
Influenza B by PCR: NEGATIVE
Resp Syncytial Virus by PCR: NEGATIVE
SARS Coronavirus 2 by RT PCR: NEGATIVE

## 2023-09-25 LAB — LIPASE, BLOOD: Lipase: 34 U/L (ref 11–51)

## 2023-09-25 MED ORDER — ONDANSETRON HCL 4 MG/2ML IJ SOLN
4.0000 mg | Freq: Once | INTRAMUSCULAR | Status: AC
Start: 2023-09-25 — End: 2023-09-25
  Administered 2023-09-25: 4 mg via INTRAVENOUS
  Filled 2023-09-25: qty 2

## 2023-09-25 MED ORDER — KETOROLAC TROMETHAMINE 15 MG/ML IJ SOLN
15.0000 mg | Freq: Once | INTRAMUSCULAR | Status: AC
  Administered 2023-09-25: 15 mg via INTRAVENOUS
  Filled 2023-09-25: qty 1

## 2023-09-25 MED ORDER — ONDANSETRON HCL 4 MG PO TABS: 4.0000 mg | ORAL_TABLET | Freq: Four times a day (QID) | ORAL | 0 refills | Status: AC

## 2023-09-25 MED ORDER — SODIUM CHLORIDE 0.9 % IV BOLUS
1000.0000 mL | Freq: Once | INTRAVENOUS | Status: AC
  Administered 2023-09-25: 1000 mL via INTRAVENOUS

## 2023-09-25 NOTE — ED Provider Notes (Signed)
 [facility] AT [facility] Provider Note   CSN: [id number] Arrival date & time: 09/25/23  0848     History  Chief Complaint  Patient presents with   Influenza    Flu like symptoms    Dylan Burgess is a 63 y.o. male.  This is a 63 year old male presenting emergency department for generalized malaise, myalgia, headache, nausea.  Reports last night developed cough, awoke this morning with symptoms.  Was not able to eat.  Feels weak all over.   Influenza      Home Medications Prior to Admission medications   Medication Sig Start Date End Date Taking? Authorizing Provider  amLODipine  (NORVASC ) 5 MG tablet Take 1 tablet (5 mg total) by mouth daily. 08/20/23   Newlin, Enobong, MD  atomoxetine  (STRATTERA ) 40 MG capsule Take 40 mg by mouth daily. 02/26/23   [provider]  atomoxetine  (STRATTERA ) 40 MG capsule Take 1 capsule (40 mg total) by mouth daily. 08/26/23     Blood Pressure Monitor DEVI Please provide patient with insurance approved blood pressure monitor 12/24/22   Theotis Haze ORN, NP  buPROPion  (WELLBUTRIN  XL) 150 MG 24 hr tablet Take 150 mg by mouth every morning. 01/28/23   [provider]  buPROPion  (WELLBUTRIN  XL) 150 MG 24 hr tablet Take 1 tablet by mouth every morning 08/26/23     clonazePAM (KLONOPIN) 0.5 MG tablet Take 0.25-0.5 mg by mouth 2 (two) times daily as needed for anxiety. 01/28/23   [provider]  escitalopram  (LEXAPRO ) 10 MG tablet Take 1 tablet (10 mg total) by mouth daily. 12/24/22   Fleming, Zelda W, NP  famotidine  (PEPCID ) 40 MG tablet Take 1 tablet (40 mg total) by mouth daily. 09/23/23   Barrett, Shona MATSU, PA-C  ibuprofen  (ADVIL ) 800 MG tablet Take 1 tablet (800 mg total) by mouth every 8 (eight) hours as needed. 07/19/23   Sofia, Leslie K, PA-C  LORazepam  (ATIVAN ) 0.5 MG tablet Take 0.25-0.5 mg by mouth 2 (two) times daily as needed. 06/03/23   [provider]  LORazepam  (ATIVAN ) 0.5 MG  tablet Take 0.5-1 tablets (0.25-0.5 mg total) by mouth 2 (two) times daily as needed. (max 1 tab daily) 08/22/23     losartan  (COZAAR ) 50 MG tablet Take 1 tablet (50 mg total) by mouth daily. 08/20/23   Newlin, Enobong, MD  ondansetron  (ZOFRAN ) 4 MG tablet Take 1 tablet (4 mg total) by mouth every 8 (eight) hours as needed for nausea or vomiting. 08/19/23   Logan Ubaldo NOVAK, PA-C  ondansetron  (ZOFRAN -ODT) 4 MG disintegrating tablet Take 1 tablet (4 mg total) by mouth every 8 (eight) hours as needed for nausea or vomiting. 09/19/23   Barrett, Warren SAILOR, PA-C  PEG-KCl-NaCl-NaSulf-Na Asc-C (PLENVU ) 140 g SOLR Take 1 kit by mouth as directed. 06/10/23   Zehr, Jessica D, PA-C  propranolol  (INDERAL ) 10 MG tablet Take 10 mg by mouth 2 (two) times daily. 06/03/23   [provider]  propranolol  (INDERAL ) 10 MG tablet Take 1 tablet (10 mg total) by mouth 2 (two) times daily. 08/26/23     rosuvastatin  (CRESTOR ) 5 MG tablet Take 1 tablet (5 mg total) by mouth daily. 08/20/23   Newlin, Enobong, MD  sildenafil  (VIAGRA ) 100 MG tablet Take 0.5-1 tablets (50-100 mg total) by mouth daily as needed for erectile dysfunction. 02/04/23   Fleming, Zelda W, NP  metoprolol  tartrate (LOPRESSOR ) 25 MG tablet Take 1 tablet (25 mg total) by mouth 2 (two) times daily. Patient not  taking: Reported on 03/15/2019 04/21/18 01/20/20  Adella Norris, MD      Allergies    Patient has no allergy information on record.    Review of Systems   Review of Systems  Physical Exam Updated Vital Signs BP 124/73   Pulse 80   Temp 99.7 F (37.6 C)   Resp 13   SpO2 98%  Physical Exam Vitals and nursing note reviewed.  Constitutional:      General: He is not in acute distress.    Appearance: He is not toxic-appearing.  HENT:     Head: Normocephalic.     Nose: Nose normal.     Mouth/Throat:     Mouth: Mucous membranes are moist.  Eyes:     Conjunctiva/sclera: Conjunctivae normal.  Cardiovascular:     Rate and Rhythm: Normal rate  and regular rhythm.  Pulmonary:     Effort: Pulmonary effort is normal.     Breath sounds: Normal breath sounds.  Abdominal:     General: There is no distension.     Tenderness: There is no abdominal tenderness. There is no guarding or rebound.  Musculoskeletal:        General: Normal range of motion.  Skin:    General: Skin is warm.  Neurological:     General: No focal deficit present.     Mental Status: He is alert.  Psychiatric:        Mood and Affect: Mood normal.        Behavior: Behavior normal.     ED Results / Procedures / Treatments   Labs (all labs ordered are listed, but only abnormal results are displayed) Labs Reviewed  RESP PANEL BY RT-PCR (RSV, FLU A&B, COVID)  RVPGX2 - Abnormal; Notable for the following components:      Result Value   Influenza A by PCR POSITIVE (*)    All other components within normal limits  CBC - Abnormal; Notable for the following components:   WBC 10.9 (*)    All other components within normal limits  COMPREHENSIVE METABOLIC PANEL - Abnormal; Notable for the following components:   Glucose, Bld 110 (*)    Calcium  8.3 (*)    All other components within normal limits  LIPASE, BLOOD    EKG None  Radiology DG Chest Portable 1 View Result Date: 09/25/2023 CLINICAL DATA:  Cough.  Nausea. EXAM: PORTABLE CHEST 1 VIEW COMPARISON:  09/17/2022. FINDINGS: Low lung volume. There is central vascular congestion, likely accentuated by low lung volume. No frank pulmonary edema. Bilateral lung fields are clear. Bilateral costophrenic angles are clear. Elevated right hemidiaphragm noted. Normal cardio-mediastinal silhouette. No acute osseous abnormalities. The soft tissues are within normal limits. IMPRESSION: *Central vascular congestion, likely accentuated by low lung volume. No frank pulmonary edema. No acute lung consolidation or collapse. Electronically Signed   By: Ree Molt M.D.   On: 09/25/2023 09:22    Procedures Procedures     Medications Ordered in ED Medications  ketorolac  (TORADOL ) 15 MG/ML injection 15 mg (has no administration in time range)  sodium chloride  0.9 % bolus 1,000 mL (1,000 mLs Intravenous New Bag/Given 09/25/23 0927)  ondansetron  (ZOFRAN ) injection 4 mg (4 mg Intravenous Given 09/25/23 9073)    ED Course/ Medical Decision Making/ A&P Clinical Course as of 09/25/23 1249  Fri Sep 25, 2023  1144 Influenza A By PCR(!): POSITIVE [TY]  1247 CBC(!) Reassuring mild leukocytosis [TY]  1247 Comprehensive metabolic panel(!) No significant metabolic derangements.  No transaminitis to  suggest hepatobiliary disease [TY]  1247 Lipase: 34 Normal.  Pancreatitis unlikely [TY]  1247 DG Chest Portable 1 View No pneumonia [TY]  1247 Patient feeling improved after Zofran  and IV fluids.  He is flu positive which would explain his symptoms.  Will give Toradol  for his myalgias.  Offered Tamiflu; he declined.  Will discharge with supportive care instructions.  Stable for discharge at this time. [TY]    Clinical Course User Index [TY] Neysa Caron PARAS, DO                                 Medical Decision Making This is a well-appearing 63 year old male presenting emergency department with viral syndrome type complaints.  Reports symptoms seemingly subsided from prior ED visits as he was seen twice in the past month for nausea vomiting.  He was fine until last night.  On arrival he is afebrile nontachycardic reassuring blood pressure.  Has a nonfocal neurologic exam.  Soft benign abdomen.  Will get screening labs.  Will get chest x-ray given his cough to evaluate for pneumonia.  Also given symptoms concern for possible viral etiology will test for flu/COVID/RSV.  Will give patient IV fluids and Zofran  for supportive care.  See ED course for further MDM/disposition.  Amount and/or Complexity of Data Reviewed External Data Reviewed:     Details: Appears patient had negative CT abdomen in March 2024 Labs: ordered.  Decision-making details documented in ED Course. Radiology: ordered. Decision-making details documented in ED Course. ECG/medicine tests: ordered. Decision-making details documented in ED Course.  Risk Prescription drug management. Decision regarding hospitalization.          Final Clinical Impression(s) / ED Diagnoses Final diagnoses:  Influenza    Rx / DC Orders ED Discharge Orders     None         Neysa Caron PARAS, DO 09/25/23 1249

## 2023-09-25 NOTE — Discharge Instructions (Addendum)
 You have the flu.  Take over-the-counter Tylenol  alternating with ibuprofen  for fever and muscle soreness.  Please use doses as directed on the packaging.  Return if develop chest pain, shortness of breath lethargy, uncontrolled nausea vomiting or any new or worsening symptoms that are concerning to you.

## 2023-09-25 NOTE — ED Notes (Signed)
 Declined d/c vitals, took off his equipment.

## 2023-09-25 NOTE — ED Triage Notes (Signed)
 PT BIB EMS from [facility], c/o nausea and flu like symptoms.   128/70 HR 77 Resp 18 96% RA CBG 160 Temp 97.5

## 2023-09-29 NOTE — Congregational Nurse Program (Signed)
  Dept: [phone]   Congregational Nurse Program Note  Date of Encounter: 09/29/2023  Clinic visit to pick up glasses that were from [facility].  Resident stated they were not the glasses that he had picked out when he went for the fitting.  [employer] case manager notified of the issue and that he didn't want to keep the glasses.  Case manager will contact [employer] to determine next steps to get the correct prescription glasses. Past Medical History: Past Medical History:  Diagnosis Date   ADHD (attention deficit hyperactivity disorder)    Anxiety    Depression    Hypertension     Encounter Details:  Community Questionnaire - 09/29/23 1040       Questionnaire   Ask client: Do you give verbal consent for me to treat you today? Yes    Student Assistance N/A    Location Patient Served  [facility]    Encounter Setting CN site    Population Status Unhoused    Insurance Medicaid    Insurance/Financial Assistance Referral N/A    Medication Have Medication Insecurities    Medical Provider Yes    Screening Referrals Made N/A    Medical Referrals Made Vision    Medical Appointment Completed N/A    CNP Interventions Advocate/Support;Counsel;Educate;Case Management    Screenings CN Performed Blood Pressure    ED Visit Averted N/A    Life-Saving Intervention Made N/A

## 2023-10-02 ENCOUNTER — Other Ambulatory Visit: Payer: Self-pay

## 2023-10-07 ENCOUNTER — Encounter: Payer: Self-pay | Admitting: Critical Care Medicine

## 2023-10-08 ENCOUNTER — Other Ambulatory Visit: Payer: Self-pay

## 2023-10-08 MED ORDER — LORAZEPAM 0.5 MG PO TABS
0.2500 mg | ORAL_TABLET | Freq: Two times a day (BID) | ORAL | 0 refills | Status: AC | PRN
  Filled 2023-10-08 – 2024-02-25 (×3): qty 14, 7d supply, fill #0

## 2023-10-08 NOTE — Progress Notes (Signed)
This patient comes to the shelter clinic essentially wanting his lorazepam filled he apparently sees a mental health doctor in [place] and now is here in [place] at the shelter.  He does have a local psychiatrist.  He has all of his other medications he simply wanted his lorazepam f obtained  I told the patient he should call his psychiatrist for that we do not write for schedule II medications in our shelter he said he would do so

## 2023-10-09 ENCOUNTER — Telehealth: Payer: Self-pay | Admitting: *Deleted

## 2023-10-09 NOTE — Telephone Encounter (Signed)
Chart reviewed and Administrator, Civil Service (DPR) verification completed.     Chart review highlights and potential barriers to colorectal cancer screening (CRC) screening:  Per patient's health maintenance, patient has never had Fecal occult blood, imunochemical test completed. Fecal occult blood, imunochemical test order and cancelled on 12/24/22. Patient has been referred to [facility] for colonoscopy on 12/24/22. Patient had another GI referral placed on 04/01/23 during [facility] office visit, for Abnormal CT of the abdomen and Other constipation.  Patient's partner accompanied patient to 04/01/23  GI office visit. Both referrals were completed on 04/01/23. Patient was scheduled for colonoscopy on 06/10/23, arrived for procedure, but did not have a care partner with him, and procedure was rescheduled to 08/04/23.  Patient was a no show for 08/04/23 colonoscopy and has not called to reschedule.  Per 08/20/23 office visit note with Dr. Alvis Lemmings, patient has a pending colonoscopy which has been delayed due to personal circumstances. Patient will reschedule colonoscopy when patient's situation stabilizes. Patient has been dealing with the recent death of his partner and is experiencing depression. He is interested in grief counseling and is currently receiving services through [facility]. Congregational Nurse Program was working with patient at the clinic on 09/29/23 and patient was seen by a provider at the [facility] on 10/07/23.    Telephone call to patient's home number, no answer, message states call cannot be completed at this time, and unable to leave a message.   Telephone call to patient's mobile number times 2 , no answer, line busy, and unable to leave a message.  Dylan Burgess H. Docia Furl, Charity fundraiser, [facility] [phone]

## 2023-10-20 ENCOUNTER — Other Ambulatory Visit: Payer: Self-pay

## 2023-12-10 ENCOUNTER — Other Ambulatory Visit: Payer: Self-pay

## 2023-12-10 ENCOUNTER — Encounter: Payer: Self-pay | Admitting: Physician Assistant

## 2023-12-10 ENCOUNTER — Other Ambulatory Visit: Payer: Self-pay | Admitting: Internal Medicine

## 2023-12-10 DIAGNOSIS — I10 Essential (primary) hypertension: Secondary | ICD-10-CM

## 2023-12-10 DIAGNOSIS — K219 Gastro-esophageal reflux disease without esophagitis: Secondary | ICD-10-CM

## 2023-12-10 DIAGNOSIS — E785 Hyperlipidemia, unspecified: Secondary | ICD-10-CM

## 2023-12-10 MED ORDER — BUPROPION HCL ER (XL) 150 MG PO TB24
150.0000 mg | ORAL_TABLET | Freq: Every morning | ORAL | 0 refills | Status: DC
  Filled 2023-12-10: qty 30, 30d supply, fill #0

## 2023-12-10 MED ORDER — AMLODIPINE BESYLATE 5 MG PO TABS
5.0000 mg | ORAL_TABLET | Freq: Every day | ORAL | 1 refills | Status: DC
  Filled 2023-12-10: qty 90, 90d supply, fill #0

## 2023-12-10 MED ORDER — PROPRANOLOL HCL 10 MG PO TABS
10.0000 mg | ORAL_TABLET | Freq: Two times a day (BID) | ORAL | 2 refills | Status: DC
  Filled 2023-12-10: qty 60, 30d supply, fill #0

## 2023-12-10 MED ORDER — FAMOTIDINE 40 MG PO TABS
40.0000 mg | ORAL_TABLET | Freq: Every day | ORAL | 1 refills | Status: AC
  Filled 2023-12-10: qty 30, 30d supply, fill #0
  Filled 2024-02-02 – 2024-02-25 (×2): qty 30, 30d supply, fill #1

## 2023-12-10 MED ORDER — ROSUVASTATIN CALCIUM 5 MG PO TABS
5.0000 mg | ORAL_TABLET | Freq: Every day | ORAL | 1 refills | Status: DC
  Filled 2023-12-10: qty 30, 30d supply, fill #0

## 2023-12-10 MED ORDER — LOSARTAN POTASSIUM 50 MG PO TABS
50.0000 mg | ORAL_TABLET | Freq: Every day | ORAL | 0 refills | Status: DC
  Filled 2023-12-10: qty 30, 30d supply, fill #0

## 2023-12-10 NOTE — Progress Notes (Addendum)
 He needs refills on his meds. Med list was updated, duplicate Rx removed, he is also no longer on Lexapro or metoprolol.  He is out of psych meds, has not taken any psych meds in several weeks. He still has a few BP pills, but is getting very low.   Med list updated, he got a 90 day supply on Psych meds in December. No feelings of wanting to harm himself or others.  He missed an appt w/ Dr Leighton Roach >> says will reschedule. Lynden Ang will get him bus passes for that.   Mult meds refilled, except the Strattera >> he will call Dr Leighton Roach for that one.   We did the Wellbutrin, amlodipine, Pepcid, Inderal, Crestor.  Prior to Admission medications   Medication Sig Start Date End Date Taking? Authorizing Provider  amLODipine (NORVASC) 5 MG tablet Take 1 tablet (5 mg total) by mouth daily. 08/20/23   Hoy Register, MD  atomoxetine (STRATTERA) 40 MG capsule Take 1 capsule (40 mg total) by mouth daily. 08/26/23     Blood Pressure Monitor DEVI Please provide patient with insurance approved blood pressure monitor 12/24/22   Claiborne Rigg, NP  buPROPion (WELLBUTRIN XL) 150 MG 24 hr tablet Take 1 tablet by mouth every morning 08/26/23     clonazePAM (KLONOPIN) 0.5 MG tablet Take 0.25-0.5 mg by mouth 2 (two) times daily as needed for anxiety. 01/28/23   [provider]  famotidine (PEPCID) 40 MG tablet Take 1 tablet (40 mg total) by mouth daily. 09/23/23   Snyder Colavito, Joline Salt, PA-C  ibuprofen (ADVIL) 800 MG tablet Take 1 tablet (800 mg total) by mouth every 8 (eight) hours as needed. 07/19/23   Elson Areas, PA-C  LORazepam (ATIVAN) 0.5 MG tablet Take 0.5-1 tablets (0.25-0.5 mg total) by mouth 2 (two) times daily as needed. 10/07/23     losartan (COZAAR) 50 MG tablet Take 1 tablet (50 mg total) by mouth daily. 08/20/23   Hoy Register, MD  ondansetron (ZOFRAN-ODT) 4 MG disintegrating tablet Take 1 tablet (4 mg total) by mouth every 8 (eight) hours as needed for nausea or vomiting. 09/19/23   Leeam Cedrone, Horald Chestnut,  PA-C  PEG-KCl-NaCl-NaSulf-Na Asc-C (PLENVU) 140 g SOLR Take 1 kit by mouth as directed. 06/10/23   Zehr, Princella Pellegrini, PA-C  propranolol (INDERAL) 10 MG tablet Take 1 tablet (10 mg total) by mouth 2 (two) times daily. 08/26/23     rosuvastatin (CRESTOR) 5 MG tablet Take 1 tablet (5 mg total) by mouth daily. 08/20/23   Hoy Register, MD  sildenafil (VIAGRA) 100 MG tablet Take 0.5-1 tablets (50-100 mg total) by mouth daily as needed for erectile dysfunction. 02/04/23   Claiborne Rigg, NP    Theodore Demark, PA-C 12/10/2023 10:19 AM  Patient with no complaints. He denies suicidal thought. Refilled for medications given.

## 2023-12-18 ENCOUNTER — Other Ambulatory Visit: Payer: Self-pay

## 2023-12-18 MED ORDER — PROPRANOLOL HCL 10 MG PO TABS
10.0000 mg | ORAL_TABLET | Freq: Two times a day (BID) | ORAL | 0 refills | Status: DC
Start: 2023-12-18 — End: 2024-01-10
  Filled 2023-12-18: qty 60, 30d supply, fill #0

## 2023-12-18 MED ORDER — ATOMOXETINE HCL 40 MG PO CAPS
40.0000 mg | ORAL_CAPSULE | Freq: Every day | ORAL | 0 refills | Status: DC
  Filled 2023-12-18: qty 90, 90d supply, fill #0

## 2023-12-18 MED ORDER — BUPROPION HCL ER (XL) 150 MG PO TB24
150.0000 mg | ORAL_TABLET | Freq: Every morning | ORAL | 0 refills | Status: DC
  Filled 2024-02-02 – 2024-02-25 (×2): qty 90, 90d supply, fill #0

## 2023-12-21 ENCOUNTER — Other Ambulatory Visit: Payer: Self-pay

## 2023-12-22 ENCOUNTER — Other Ambulatory Visit: Payer: Self-pay

## 2023-12-29 ENCOUNTER — Other Ambulatory Visit: Payer: Self-pay

## 2023-12-30 ENCOUNTER — Other Ambulatory Visit: Payer: Self-pay

## 2023-12-30 MED ORDER — BUPROPION HCL ER (XL) 150 MG PO TB24
150.0000 mg | ORAL_TABLET | Freq: Every morning | ORAL | 0 refills | Status: DC
  Filled 2023-12-30: qty 90, 90d supply, fill #0

## 2023-12-30 MED ORDER — LORAZEPAM 0.5 MG PO TABS
0.5000 mg | ORAL_TABLET | Freq: Two times a day (BID) | ORAL | 2 refills | Status: AC | PRN
Start: 2023-12-30 — End: ?
  Filled 2023-12-30: qty 30, 30d supply, fill #0

## 2023-12-30 MED ORDER — PROPRANOLOL HCL 10 MG PO TABS
10.0000 mg | ORAL_TABLET | Freq: Two times a day (BID) | ORAL | 0 refills | Status: DC
  Filled 2023-12-30: qty 180, 90d supply, fill #0

## 2023-12-30 MED ORDER — ATOMOXETINE HCL 40 MG PO CAPS
40.0000 mg | ORAL_CAPSULE | Freq: Every day | ORAL | 0 refills | Status: DC
  Filled 2023-12-30: qty 90, 90d supply, fill #0

## 2024-01-08 ENCOUNTER — Encounter (HOSPITAL_COMMUNITY): Payer: Self-pay | Admitting: Neurology

## 2024-01-08 ENCOUNTER — Inpatient Hospital Stay (HOSPITAL_COMMUNITY)
Admission: EM | Admit: 2024-01-08 | Discharge: 2024-01-10 | DRG: 062 | Disposition: A | Discharge status: Home / Self Care | Attending: Neurology | Admitting: Neurology

## 2024-01-08 ENCOUNTER — Other Ambulatory Visit: Payer: Self-pay

## 2024-01-08 ENCOUNTER — Inpatient Hospital Stay (HOSPITAL_COMMUNITY)

## 2024-01-08 ENCOUNTER — Emergency Department (HOSPITAL_COMMUNITY)

## 2024-01-08 DIAGNOSIS — I6389 Other cerebral infarction: Secondary | ICD-10-CM | POA: Diagnosis not present

## 2024-01-08 DIAGNOSIS — Z79899 Other long term (current) drug therapy: Secondary | ICD-10-CM | POA: Diagnosis not present

## 2024-01-08 DIAGNOSIS — Z7982 Long term (current) use of aspirin: Secondary | ICD-10-CM | POA: Diagnosis not present

## 2024-01-08 DIAGNOSIS — R29700 NIHSS score 0: Secondary | ICD-10-CM | POA: Diagnosis not present

## 2024-01-08 DIAGNOSIS — G8194 Hemiplegia, unspecified affecting left nondominant side: Secondary | ICD-10-CM | POA: Diagnosis present

## 2024-01-08 DIAGNOSIS — Z833 Family history of diabetes mellitus: Secondary | ICD-10-CM

## 2024-01-08 DIAGNOSIS — F411 Generalized anxiety disorder: Secondary | ICD-10-CM | POA: Insufficient documentation

## 2024-01-08 DIAGNOSIS — Z8249 Family history of ischemic heart disease and other diseases of the circulatory system: Secondary | ICD-10-CM | POA: Diagnosis not present

## 2024-01-08 DIAGNOSIS — R29703 NIHSS score 3: Secondary | ICD-10-CM | POA: Diagnosis present

## 2024-01-08 DIAGNOSIS — F32A Depression, unspecified: Secondary | ICD-10-CM | POA: Diagnosis present

## 2024-01-08 DIAGNOSIS — R2990 Unspecified symptoms and signs involving the nervous system: Secondary | ICD-10-CM | POA: Insufficient documentation

## 2024-01-08 DIAGNOSIS — G459 Transient cerebral ischemic attack, unspecified: Secondary | ICD-10-CM | POA: Diagnosis present

## 2024-01-08 DIAGNOSIS — I639 Cerebral infarction, unspecified: Secondary | ICD-10-CM | POA: Diagnosis present

## 2024-01-08 DIAGNOSIS — E876 Hypokalemia: Secondary | ICD-10-CM | POA: Diagnosis present

## 2024-01-08 DIAGNOSIS — Z8673 Personal history of transient ischemic attack (TIA), and cerebral infarction without residual deficits: Secondary | ICD-10-CM

## 2024-01-08 DIAGNOSIS — E785 Hyperlipidemia, unspecified: Secondary | ICD-10-CM | POA: Diagnosis present

## 2024-01-08 DIAGNOSIS — Z841 Family history of disorders of kidney and ureter: Secondary | ICD-10-CM | POA: Diagnosis not present

## 2024-01-08 DIAGNOSIS — Z7902 Long term (current) use of antithrombotics/antiplatelets: Secondary | ICD-10-CM | POA: Diagnosis not present

## 2024-01-08 DIAGNOSIS — F909 Attention-deficit hyperactivity disorder, unspecified type: Secondary | ICD-10-CM | POA: Diagnosis present

## 2024-01-08 DIAGNOSIS — Z87891 Personal history of nicotine dependence: Secondary | ICD-10-CM

## 2024-01-08 DIAGNOSIS — R29810 Facial weakness: Secondary | ICD-10-CM | POA: Diagnosis present

## 2024-01-08 DIAGNOSIS — Z83438 Family history of other disorder of lipoprotein metabolism and other lipidemia: Secondary | ICD-10-CM

## 2024-01-08 DIAGNOSIS — I10 Essential (primary) hypertension: Secondary | ICD-10-CM | POA: Diagnosis present

## 2024-01-08 DIAGNOSIS — F419 Anxiety disorder, unspecified: Secondary | ICD-10-CM | POA: Diagnosis present

## 2024-01-08 LAB — CBC
HCT: 43.9 % (ref 39.0–52.0)
Hemoglobin: 14.1 g/dL (ref 13.0–17.0)
MCH: 26.9 pg (ref 26.0–34.0)
MCHC: 32.1 g/dL (ref 30.0–36.0)
MCV: 83.6 fL (ref 80.0–100.0)
Platelets: 273 10*3/uL (ref 150–400)
RBC: 5.25 MIL/uL (ref 4.22–5.81)
RDW: 14 % (ref 11.5–15.5)
WBC: 12 10*3/uL — ABNORMAL HIGH (ref 4.0–10.5)
nRBC: 0 % (ref 0.0–0.2)

## 2024-01-08 LAB — DIFFERENTIAL
Abs Immature Granulocytes: 0.05 10*3/uL (ref 0.00–0.07)
Basophils Absolute: 0 10*3/uL (ref 0.0–0.1)
Basophils Relative: 0 %
Eosinophils Absolute: 0.2 10*3/uL (ref 0.0–0.5)
Eosinophils Relative: 1 %
Immature Granulocytes: 0 %
Lymphocytes Relative: 12 %
Lymphs Abs: 1.4 10*3/uL (ref 0.7–4.0)
Monocytes Absolute: 1.1 10*3/uL — ABNORMAL HIGH (ref 0.1–1.0)
Monocytes Relative: 9 %
Neutro Abs: 9.3 10*3/uL — ABNORMAL HIGH (ref 1.7–7.7)
Neutrophils Relative %: 78 %

## 2024-01-08 LAB — I-STAT CHEM 8, ED
BUN: 12 mg/dL (ref 6–20)
Calcium, Ion: 1.06 mmol/L — ABNORMAL LOW (ref 1.15–1.40)
Chloride: 103 mmol/L (ref 98–111)
Creatinine, Ser: 1.1 mg/dL (ref 0.61–1.24)
Glucose, Bld: 150 mg/dL — ABNORMAL HIGH (ref 70–99)
HCT: 45 % (ref 39.0–52.0)
Hemoglobin: 15.3 g/dL (ref 13.0–17.0)
Potassium: 3.9 mmol/L (ref 3.5–5.1)
Sodium: 139 mmol/L (ref 135–145)
TCO2: 27 mmol/L (ref 22–32)

## 2024-01-08 LAB — RAPID URINE DRUG SCREEN, HOSP PERFORMED
Amphetamines: NOT DETECTED
Barbiturates: NOT DETECTED
Benzodiazepines: NOT DETECTED
Cocaine: NOT DETECTED
Opiates: NOT DETECTED
Tetrahydrocannabinol: NOT DETECTED

## 2024-01-08 LAB — APTT: aPTT: 31 s (ref 24–36)

## 2024-01-08 LAB — COMPREHENSIVE METABOLIC PANEL WITH GFR
ALT: 17 U/L (ref 0–44)
AST: 20 U/L (ref 15–41)
Albumin: 3.7 g/dL (ref 3.5–5.0)
Alkaline Phosphatase: 72 U/L (ref 38–126)
Anion gap: 11 (ref 5–15)
BUN: 10 mg/dL (ref 6–20)
CO2: 23 mmol/L (ref 22–32)
Calcium: 8.4 mg/dL — ABNORMAL LOW (ref 8.9–10.3)
Chloride: 102 mmol/L (ref 98–111)
Creatinine, Ser: 1.09 mg/dL (ref 0.61–1.24)
GFR, Estimated: >60 mL/min (ref >60)
Glucose, Bld: 152 mg/dL — ABNORMAL HIGH (ref 70–99)
Potassium: 3.7 mmol/L (ref 3.5–5.1)
Sodium: 136 mmol/L (ref 135–145)
Total Bilirubin: 0.7 mg/dL (ref 0.0–1.2)
Total Protein: 6.9 g/dL (ref 6.5–8.1)

## 2024-01-08 LAB — PROTIME-INR
INR: 1.1 (ref 0.8–1.2)
Prothrombin Time: 14.3 s (ref 11.4–15.2)

## 2024-01-08 LAB — CBG MONITORING, ED: Glucose-Capillary: 151 mg/dL — ABNORMAL HIGH (ref 70–99)

## 2024-01-08 LAB — GLUCOSE, CAPILLARY
Glucose-Capillary: 101 mg/dL — ABNORMAL HIGH (ref 70–99)
Glucose-Capillary: 112 mg/dL — ABNORMAL HIGH (ref 70–99)

## 2024-01-08 LAB — ETHANOL: Alcohol, Ethyl (B): <15 mg/dL (ref <15)

## 2024-01-08 LAB — HEMOGLOBIN A1C
Hgb A1c MFr Bld: 6.5 % — ABNORMAL HIGH (ref 4.8–5.6)
Mean Plasma Glucose: 139.85 mg/dL

## 2024-01-08 MED ORDER — TENECTEPLASE FOR STROKE
0.2500 mg/kg | PACK | Freq: Once | INTRAVENOUS | Status: AC
  Administered 2024-01-08: 23 mg via INTRAVENOUS
  Filled 2024-01-08: qty 10

## 2024-01-08 MED ORDER — ACETAMINOPHEN 650 MG RE SUPP: 650.0000 mg | RECTAL | Status: DC | PRN

## 2024-01-08 MED ORDER — IOHEXOL 350 MG/ML SOLN
75.0000 mL | Freq: Once | INTRAVENOUS | Status: AC | PRN
  Administered 2024-01-08: 75 mL via INTRAVENOUS

## 2024-01-08 MED ORDER — PANTOPRAZOLE SODIUM 40 MG IV SOLR
40.0000 mg | Freq: Every day | INTRAVENOUS | Status: DC
  Administered 2024-01-08 – 2024-01-09 (×2): 40 mg via INTRAVENOUS
  Filled 2024-01-08 (×2): qty 10

## 2024-01-08 MED ORDER — ACETAMINOPHEN 325 MG PO TABS: 650.0000 mg | ORAL_TABLET | ORAL | Status: DC | PRN

## 2024-01-08 MED ORDER — ACETAMINOPHEN-CODEINE 300-30 MG PO TABS
2.0000 | ORAL_TABLET | Freq: Once | ORAL | Status: AC
  Administered 2024-01-08: 2 via ORAL
  Filled 2024-01-08: qty 2

## 2024-01-08 MED ORDER — ACETAMINOPHEN 160 MG/5ML PO SOLN: 650.0000 mg | ORAL | Status: DC | PRN

## 2024-01-08 MED ORDER — SENNOSIDES-DOCUSATE SODIUM 8.6-50 MG PO TABS: 1.0000 | ORAL_TABLET | Freq: Every evening | ORAL | Status: DC | PRN

## 2024-01-08 MED ORDER — STROKE: EARLY STAGES OF RECOVERY BOOK
Freq: Once | Status: AC
  Filled 2024-01-08: qty 1

## 2024-01-08 MED ORDER — SODIUM CHLORIDE 0.9 % IV SOLN: INTRAVENOUS | Status: DC

## 2024-01-08 NOTE — ED Provider Notes (Signed)
 [place] EMERGENCY DEPARTMENT AT [facility] Provider Note   CSN: [id number] Arrival date & time: 01/08/24  1303     History   Dylan Burgess is a 63 y.o. male.  HPI   Patient presented to the ER for evaluation of acute weakness.  Patient has history of hypertension, dyslipidemia, ADHD, anxiety.  Patient presented with complaints of acute onset of leg weakness that started shortly prior to arrival.  Patient was activated as a code stroke.  Patient denies any speech difficulty.  No trouble with his vision.  He is experiencing a headache that started earlier in the morning.  Patient was seen by stroke team on arrival  Home Medications Prior to Admission medications   Medication Sig Start Date End Date Taking? Authorizing Provider  amLODipine  (NORVASC ) 5 MG tablet Take 1 tablet (5 mg total) by mouth daily. 12/10/23   Regalado, Belkys A, MD  atomoxetine  (STRATTERA ) 40 MG capsule Take 1 capsule (40 mg total) by mouth daily. 08/26/23     atomoxetine  (STRATTERA ) 40 MG capsule Take 1 capsule (40 mg total) by mouth daily. 12/18/23     atomoxetine  (STRATTERA ) 40 MG capsule Take 1 capsule (40 mg total) by mouth daily. 12/30/23     Blood Pressure Monitor DEVI Please provide patient with insurance approved blood pressure monitor 12/24/22   Collins Dean, NP  buPROPion  (WELLBUTRIN  XL) 150 MG 24 hr tablet Take 1 tablet (150 mg total) by mouth every morning. 12/18/23     buPROPion  (WELLBUTRIN  XL) 150 MG 24 hr tablet Take 1 tablet by mouth every morning 12/30/23     clonazePAM (KLONOPIN) 0.5 MG tablet Take 0.25-0.5 mg by mouth 2 (two) times daily as needed for anxiety. 01/28/23   [provider]  famotidine  (PEPCID ) 40 MG tablet Take 1 tablet (40 mg total) by mouth daily. 12/10/23   Regalado, Belkys A, MD  ibuprofen  (ADVIL ) 800 MG tablet Take 1 tablet (800 mg total) by mouth every 8 (eight) hours as needed. 07/19/23   Sofia, Leslie K, PA-C  LORazepam  (ATIVAN ) 0.5 MG tablet Take 0.5-1  tablets (0.25-0.5 mg total) by mouth 2 (two) times daily as needed. 10/07/23     LORazepam  (ATIVAN ) 0.5 MG tablet Take 1-2 tablets (0.5-1 mg total) by mouth 2 (two) times daily as needed. (Max 1 tablet daily) 12/30/23     losartan  (COZAAR ) 50 MG tablet Take 1 tablet (50 mg total) by mouth daily. 12/10/23   Regalado, Belkys A, MD  ondansetron  (ZOFRAN -ODT) 4 MG disintegrating tablet Take 1 tablet (4 mg total) by mouth every 8 (eight) hours as needed for nausea or vomiting. 09/19/23   Barrett, Kandace Organ, PA-C  PEG-KCl-NaCl-NaSulf-Na Asc-C (PLENVU ) 140 g SOLR Take 1 kit by mouth as directed. 06/10/23   Zehr, Jessica D, PA-C  propranolol  (INDERAL ) 10 MG tablet Take 1 tablet (10 mg total) by mouth 2 (two) times daily. 12/10/23   Regalado, Belkys A, MD  propranolol  (INDERAL ) 10 MG tablet Take 1 tablet (10 mg total) by mouth 2 (two) times daily. 12/18/23     propranolol  (INDERAL ) 10 MG tablet Take 1 tablet by mouth twice daily 12/30/23     rosuvastatin  (CRESTOR ) 5 MG tablet Take 1 tablet (5 mg total) by mouth daily. 12/10/23   Regalado, Belkys A, MD  sildenafil  (VIAGRA ) 100 MG tablet Take 0.5-1 tablets (50-100 mg total) by mouth daily as needed for erectile dysfunction. 02/04/23   Fleming, Zelda W, NP  metoprolol  tartrate (LOPRESSOR ) 25 MG tablet Take  1 tablet (25 mg total) by mouth 2 (two) times daily. Patient not taking: Reported on 03/15/2019 04/21/18 01/20/20  Ronalee Cocking, MD      Allergies    Patient has no allergy information on record.    Review of Systems   Review of Systems  Physical Exam Updated Vital Signs BP 138/79   Pulse 76   Resp 17   Ht 1.829 m (6')   Wt 93.6 kg   SpO2 100%   BMI 27.99 kg/m  Physical Exam Vitals and nursing note reviewed.  Constitutional:      General: He is not in acute distress.    Appearance: He is well-developed.  HENT:     Head: Normocephalic and atraumatic.     Right Ear: External ear normal.     Left Ear: External ear normal.  Eyes:     General: No scleral  icterus.       Right eye: No discharge.        Left eye: No discharge.     Conjunctiva/sclera: Conjunctivae normal.  Neck:     Trachea: No tracheal deviation.  Cardiovascular:     Rate and Rhythm: Normal rate.  Pulmonary:     Effort: Pulmonary effort is normal. No respiratory distress.     Breath sounds: No stridor.  Abdominal:     General: There is no distension.  Musculoskeletal:        General: No swelling or deformity.     Cervical back: Neck supple.  Skin:    General: Skin is warm and dry.     Findings: No rash.  Neurological:     Mental Status: He is alert and oriented to person, place, and time.     Cranial Nerves: No cranial nerve deficit, dysarthria or facial asymmetry.     Motor: Weakness present. No seizure activity.     Comments: Full evaluation per stroke team, patient noted to have drift of the left lower extremity     ED Results / Procedures / Treatments   Labs (all labs ordered are listed, but only abnormal results are displayed) Labs Reviewed  CBC - Abnormal; Notable for the following components:      Result Value   WBC 12.0 (*)    All other components within normal limits  DIFFERENTIAL - Abnormal; Notable for the following components:   Neutro Abs 9.3 (*)    Monocytes Absolute 1.1 (*)    All other components within normal limits  I-STAT CHEM 8, ED - Abnormal; Notable for the following components:   Glucose, Bld 150 (*)    Calcium , Ion 1.06 (*)    All other components within normal limits  CBG MONITORING, ED - Abnormal; Notable for the following components:   Glucose-Capillary 151 (*)    All other components within normal limits  PROTIME-INR  APTT  ETHANOL  COMPREHENSIVE METABOLIC PANEL WITH GFR  RAPID URINE DRUG SCREEN, HOSP PERFORMED  HIV ANTIBODY (ROUTINE TESTING W REFLEX)  HEMOGLOBIN A1C  URINALYSIS, COMPLETE (UACMP) WITH MICROSCOPIC    EKG EKG Interpretation Date/Time:  Friday January 08 2024 13:25:02 EDT Ventricular Rate:  76 PR  Interval:  209 QRS Duration:  95 QT Interval:  374 QTC Calculation: 421 R Axis:   -34  Text Interpretation: Sinus rhythm Borderline prolonged PR interval Left axis deviation No significant change since last tracing Confirmed by Trish Furl [phone]) on 01/08/2024 1:28:07 PM  Radiology CT HEAD CODE STROKE WO CONTRAST Result Date: 01/08/2024 CLINICAL DATA:  Code  stroke. Neuro deficit, left-sided weakness and numbness. EXAM: CT HEAD WITHOUT CONTRAST TECHNIQUE: Contiguous axial images were obtained from the base of the skull through the vertex without intravenous contrast. RADIATION DOSE REDUCTION: This exam was performed according to the departmental dose-optimization program which includes automated exposure control, adjustment of the mA and/or kV according to patient size and/or use of iterative reconstruction technique. COMPARISON:  CT head 07/19/2023. FINDINGS: Brain: No acute intracranial hemorrhage. No CT evidence of acute infarct. Remote lacunar infarct in the left caudate head which may be new since 2024 but appears chronic. There is likely an additional remote lacunar infarct in the left pons. No edema, mass effect, or midline shift. The basilar cisterns are patent. Ventricles: The ventricles are normal. Vascular: No hyperdense vessel or unexpected calcification. Skull: No acute or aggressive finding. Orbits: Orbits are symmetric. 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 Other: Mastoid air cells are clear. ASPECTS [facility] Stroke Program Early CT Score) - Ganglionic level infarction (caudate, lentiform nuclei, internal capsule, insula, M1-M3 cortex): 7 - Supraganglionic infarction (M4-M6 cortex): 3 Total score (0-10 with 10 being normal): 10 IMPRESSION: 1. No CT evidence of acute intracranial abnormality. 2. Remote appearing lacunar infarct in the left caudate head appears new since 2024. Additional remote lacunar infarct in the left pons is similar to prior. 3. ASPECTS is 10 These results were  communicated to Dr. Janett Medin At 1:26 pm on 01/08/2024 by text page via the [facility] messaging system. Electronically Signed   By: Denny Flack M.D.   On: 01/08/2024 13:30    Procedures Procedures    Medications Ordered in ED Medications   stroke: early stages of recovery book (has no administration in time range)  0.9 %  sodium chloride  infusion (has no administration in time range)  acetaminophen  (TYLENOL ) tablet 650 mg (has no administration in time range)    Or  acetaminophen  (TYLENOL ) 160 MG/5ML solution 650 mg (has no administration in time range)    Or  acetaminophen  (TYLENOL ) suppository 650 mg (has no administration in time range)  senna-docusate (Senokot-S) tablet 1 tablet (has no administration in time range)  pantoprazole  (PROTONIX ) injection 40 mg (has no administration in time range)  acetaminophen -codeine  (TYLENOL  #3) 300-30 MG per tablet 2 tablet (has no administration in time range)  tenecteplase  (TNKASE ) injection for Stroke 23 mg (23 mg Intravenous Given 01/08/24 1314)    ED Course/ Medical Decision Making/ A&P Clinical Course as of 01/08/24 1333  Fri Jan 08, 2024  1332 Patient's initial laboratory test showed slight leukocytosis no anemia.  Metabolic panel shows normal creatinine with glucose increased at 150.  No acidosis or other abnormalities [JK]  1332 Preliminary review of CT scan per stroke team no evidence of hemorrhage [JK]    Clinical Course User Index [JK] Trish Furl, MD                                 Medical Decision Making Problems Addressed: Acute stroke due to ischemia [facility]): acute illness or injury that poses a threat to life or bodily functions  Amount and/or Complexity of Data Reviewed Labs: ordered. Decision-making details documented in ED Course. Radiology: ordered and independent interpretation performed.  Risk Decision regarding hospitalization.   Patient's symptoms were concerning for acute stroke.  He was within thrombolytic window.   Patient was administered thrombolytics under the direction of Dr Janett Medin.  Patient will be admitted to the neuro ICU for further  treatment  1:33 PM patient remained stable after administration of thrombolytics.  No complaints of chest pain or shortness of breath.  Will continue monitor closely        Final Clinical Impression(s) / ED Diagnoses Final diagnoses:  Acute stroke due to ischemia [facility])    Rx / DC Orders ED Discharge Orders     None         Trish Furl, MD 01/08/24 [phone]

## 2024-01-08 NOTE — Code Documentation (Signed)
 Stroke Response Nurse Documentation Code Documentation  Dylan Burgess is a 63 y.o. male arriving to [facility]  via [facility] on 4/25 with past medical hx of Htn, dyslipidemia. On No antithrombotic. Code stroke was activated by EMS.   Patient from the library where he was LKW at 1145 and now complaining of LLE weakness, sensory deficit, and facial droop.   Stroke team at the bedside on patient arrival. Labs drawn and patient cleared for CT by Dr. Monnie Anthony. Patient to CT with team. NIHSS 3, see documentation for details and code stroke times. Patient with left facial droop, left leg weakness, and left decreased sensation on exam. The following imaging was completed:  CT Head. Patient is a candidate for IV Thrombolytic due to fixed neurological deficit within 4.5 hrs. Patient is not a candidate for IR due to LVO not suspected.   Care Plan: q15 NIHSS and vitals x 2 hours, q30 x6 hours, q 1 x 16 hours.   Bedside handoff with ED RN Jacqulyne Maxim.    Marlon Simpson  Stroke Response RN

## 2024-01-08 NOTE — ED Notes (Signed)
Patient transported to CT with this RN on monitor.

## 2024-01-08 NOTE — Progress Notes (Signed)
 Dylan Burgess [id number] Admission Data: 01/08/2024 7:00 PM Attending Provider: Stroke, Md, MD  YQM:VHQION, Lavelle Posey, MD Consults/ Treatment Team:   Dylan Burgess is a 63 y.o. male patient admitted from ED awake, alert  & orientated  X 3,  Full Code, VSS - Blood pressure (!) 144/86, pulse 78, temperature 98.6 F (37 C), temperature source Oral, resp. rate 17, height 6' (1.829 m), weight 89.5 kg, SpO2 93%., O2  , no c/o shortness of breath, no c/o chest pain, no distress noted. Tele 61M-15 placed and pt is currently running:normal sinus rhythm.   IV site WDL:  L with a transparent dsg that's clean dry and intact.   Skin, clean-dry- intact without evidence of bruising, or skin tears.   No evidence of skin break down noted on exam. Pt admitted to 61M room 15.      Will cont to monitor and assist as needed.  Juliet Vasbinder Rosalinda Columbus, RN 01/08/2024 7:00 PM

## 2024-01-08 NOTE — ED Notes (Signed)
 CCMD contacted

## 2024-01-08 NOTE — Progress Notes (Signed)
 PHARMACIST CODE STROKE RESPONSE  Notified to mix TNK at 1312 by Dr. Janett Medin TNK preparation completed at 1314  TNK dose = 23 mg IV over 5 seconds  Issues/delays encountered (if applicable): n/a  Angelo Kennedy Adriana Quinby 01/08/24 1:09 PM

## 2024-01-08 NOTE — H&P (Signed)
 NEUROLOGY H&P NOTE   Date of service: January 08, 2024 Patient Name: Dylan Burgess MRN:  [id number] DOB:  11/02/1960 Chief Complaint: "left leg heaviness" Requesting Provider: Stroke, Md, MD  History of Present Illness  Dylan Burgess is a 63 y.o. male with hx of hypertension, anxiety, depression who presented to the ED from the library where he was studying chess strategies when he began to feel left leg heaviness and decreased sensation to the left lower extremity at approximately 11:45 this morning.  His weakness persisted affecting his gait with some reported mental clouding as well prompting EMS activation. Patient does note that he woke up this morning at 08:00 with an atypical frontal headache that is persistent on hospital arrival rated at an 8/10 in severity.   LKW: 11:45 AM Modified rankin score: 0-Completely asymptomatic and back to baseline post- stroke IV Thrombolysis: Yes, TNK administered at 13:14 after reviewing CT head without evidence of hemorrhage and discussing risk, benefits, and alternatives to thrombolytic therapy treatment with patient and CT.  Patient verbalized understanding and provided consent for thrombolytic therapy treatment at 13:12. Patient denies any contraindications to thrombolytic therapy.  Blood pressure prior to administration: 127/76 EVT:  No, presentation is not consistent with an LVO  NIHSS components Score: Comment  1a Level of Conscious 0[x]  1[]  2[]  3[]      1b LOC Questions 0[x]  1[]  2[]       1c LOC Commands 0[x]  1[]  2[]       2 Best Gaze 0[x]  1[]  2[]       3 Visual 0[x]  1[]  2[]  3[]      4 Facial Palsy 0[]  1[x]  2[]  3[]     Slight left mouth droop   5a Motor Arm - left 0[x]  1[]  2[]  3[]  4[]  UN[]    5b Motor Arm - Right 0[x]  1[]  2[]  3[]  4[]  UN[]    6a Motor Leg - Left 0[]  1[x]  2[]  3[]  4[]  UN[]    6b Motor Leg - Right 0[x]  1[]  2[]  3[]  4[]  UN[]    7 Limb Ataxia 0[x]  1[]  2[]  3[]  UN[]     8 Sensory 0[]  1[x]  2[]  UN[]      9 Best Language 0[x]  1[]  2[]  3[]       10 Dysarthria 0[x]  1[]  2[]  UN[]      11 Extinct. and Inattention 0[x]  1[]  2[]       TOTAL:  3    ROS  Comprehensive ROS performed and pertinent positives documented in HPI   Past History   Past Medical History:  Diagnosis Date   ADHD (attention deficit hyperactivity disorder)    Anxiety    Depression    Hypertension    No past surgical history on file.  Family History: Family History  Problem Relation Age of Onset   Kidney disease Mother        was on dialysis at time of death   Diabetes Mother    Hypertension Mother    Hypertension Sister    Kidney disease Brother        kidney failure on dialysis   Hypertension Brother        Cause of kidney failure   Diabetes Brother    Hyperlipidemia Brother    Hyperlipidemia Brother    Obesity Daughter    Colon cancer Neg Hx    Rectal cancer Neg Hx    Stomach cancer Neg Hx    Esophageal cancer Neg Hx    Pancreatic cancer Neg Hx    Liver cancer Neg Hx    Social History  reports that he quit  smoking about 21 years ago. His smoking use included cigarettes. He started smoking about 26 years ago. He has never used smokeless tobacco. He reports that he does not currently use alcohol. He reports that he does not use drugs.  Not on File  Medications   Current Facility-Administered Medications:    [START ON 01/09/2024]  stroke: early stages of recovery book, , Does not apply, Once, Toberman, Stevi W, NP   0.9 %  sodium chloride  infusion, , Intravenous, Continuous, Toberman, Stevi W, NP   acetaminophen  (TYLENOL ) tablet 650 mg, 650 mg, Oral, Q4H PRN **OR** acetaminophen  (TYLENOL ) 160 MG/5ML solution 650 mg, 650 mg, Per Tube, Q4H PRN **OR** acetaminophen  (TYLENOL ) suppository 650 mg, 650 mg, Rectal, Q4H PRN, Toberman, Stevi W, NP   pantoprazole  (PROTONIX ) injection 40 mg, 40 mg, Intravenous, QHS, Toberman, Stevi W, NP   senna-docusate (Senokot-S) tablet 1 tablet, 1 tablet, Oral, QHS PRN, Toberman, Stevi W, NP  Current Outpatient  Medications:    amLODipine  (NORVASC ) 5 MG tablet, Take 1 tablet (5 mg total) by mouth daily., Disp: 90 tablet, Rfl: 1   atomoxetine  (STRATTERA ) 40 MG capsule, Take 1 capsule (40 mg total) by mouth daily., Disp: 90 capsule, Rfl: 0   atomoxetine  (STRATTERA ) 40 MG capsule, Take 1 capsule (40 mg total) by mouth daily., Disp: 90 capsule, Rfl: 0   atomoxetine  (STRATTERA ) 40 MG capsule, Take 1 capsule (40 mg total) by mouth daily., Disp: 90 capsule, Rfl: 0   Blood Pressure Monitor DEVI, Please provide patient with insurance approved blood pressure monitor, Disp: 1 each, Rfl: 0   buPROPion  (WELLBUTRIN  XL) 150 MG 24 hr tablet, Take 1 tablet (150 mg total) by mouth every morning., Disp: 90 tablet, Rfl: 0   buPROPion  (WELLBUTRIN  XL) 150 MG 24 hr tablet, Take 1 tablet by mouth every morning, Disp: 90 tablet, Rfl: 0   clonazePAM (KLONOPIN) 0.5 MG tablet, Take 0.25-0.5 mg by mouth 2 (two) times daily as needed for anxiety., Disp: , Rfl:    famotidine  (PEPCID ) 40 MG tablet, Take 1 tablet (40 mg total) by mouth daily., Disp: 30 tablet, Rfl: 1   ibuprofen  (ADVIL ) 800 MG tablet, Take 1 tablet (800 mg total) by mouth every 8 (eight) hours as needed., Disp: 30 tablet, Rfl: 0   LORazepam  (ATIVAN ) 0.5 MG tablet, Take 0.5-1 tablets (0.25-0.5 mg total) by mouth 2 (two) times daily as needed., Disp: 14 tablet, Rfl: 0   LORazepam  (ATIVAN ) 0.5 MG tablet, Take 1-2 tablets (0.5-1 mg total) by mouth 2 (two) times daily as needed. (Max 1 tablet daily), Disp: 30 tablet, Rfl: 2   losartan  (COZAAR ) 50 MG tablet, Take 1 tablet (50 mg total) by mouth daily., Disp: 30 tablet, Rfl: 0   ondansetron  (ZOFRAN -ODT) 4 MG disintegrating tablet, Take 1 tablet (4 mg total) by mouth every 8 (eight) hours as needed for nausea or vomiting., Disp: 20 tablet, Rfl: 0   PEG-KCl-NaCl-NaSulf-Na Asc-C (PLENVU ) 140 g SOLR, Take 1 kit by mouth as directed., Disp: 1 each, Rfl: 0   propranolol  (INDERAL ) 10 MG tablet, Take 1 tablet (10 mg total) by mouth 2  (two) times daily., Disp: 60 tablet, Rfl: 2   propranolol  (INDERAL ) 10 MG tablet, Take 1 tablet (10 mg total) by mouth 2 (two) times daily., Disp: 60 tablet, Rfl: 0   propranolol  (INDERAL ) 10 MG tablet, Take 1 tablet by mouth twice daily, Disp: 180 tablet, Rfl: 0   rosuvastatin  (CRESTOR ) 5 MG tablet, Take 1 tablet (5 mg total) by mouth daily.,  Disp: 30 tablet, Rfl: 1   sildenafil  (VIAGRA ) 100 MG tablet, Take 0.5-1 tablets (50-100 mg total) by mouth daily as needed for erectile dysfunction., Disp: 5 tablet, Rfl: 11  Vitals   Vitals:   2024-01-25 1300  Weight: 93.6 kg  Height: 6' (1.829 m)    Body mass index is 27.99 kg/m.  Physical Exam   Constitutional: Appears well-developed and well-nourished pleasant middle-aged African-American male.  Psych: Flat affect apparent throughout assessment.  Patient is calm and cooperative throughout exam. Eyes: No scleral injection.  HENT: No OP obstruction.  Head: Normocephalic.  Cardiovascular: Extremities warm, well-perfused, without pedal edema Respiratory: Effort normal, non-labored breathing on room air GI: Soft.  No distension. There is no tenderness.  Skin: WDI.   Neurologic Examination   Mental Status: Patient is awake, alert, oriented to person, place, month, year, and situation. Patient is able to give a clear and coherent history though he does answer in short, concise answers only to specific questions.  No signs of aphasia or neglect noted.  Cranial Nerves: II: Visual Fields are full. Pupils are equal, round, and reactive to light.   III,IV, VI: EOMI without ptosis or diploplia.  V: Facial sensation is intact and symmetric to light touch VII: Subtle left lower facial droop VIII: Hearing is intact to voice X: Palate elevates symmetrically XI: Shoulder shrug is symmetric XII: Tongue protrudes midline  Motor: Tone is normal. Bulk is normal. Patient is able to maintain antigravity strength in bilateral upper extremities and right  lower extremity.  Left lower extremity elevates antigravity with minimal vertical drift. Sensory: Decree sensation to light touch reported on the left lower extremity Deep Tendon Reflexes: 2+ and symmetric in the biceps and patellae.  Cerebellar: FNF and HKS are intact without dysmetria  NIHSS 3  Labs/Imaging/Neurodiagnostic studies   CBC:  Recent Labs  Lab 2024-01-25 1306 2024-01-25 1309  WBC 12.0*  --   NEUTROABS 9.3*  --   HGB 14.1 15.3  HCT 43.9 45.0  MCV 83.6  --   PLT 273  --    Basic Metabolic Panel:  Lab Results  Component Value Date   NA 139 01/25/2024   K 3.9 01-25-2024   CO2 26 09/25/2023   GLUCOSE 150 (H) 2024/01/25   BUN 12 Jan 25, 2024   CREATININE 1.10 01-25-2024   CALCIUM  8.3 (L) 09/25/2023   GFRNONAA >60 09/25/2023   GFRAA >60 03/10/2020   Lipid Panel:  Lab Results  Component Value Date   LDLCALC 144 (H) 12/24/2022   HgbA1c: No results found for: "HGBA1C" Urine Drug Screen:     Component Value Date/Time   LABOPIA NONE DETECTED 05/18/2022 1343   COCAINSCRNUR NONE DETECTED 05/18/2022 1343   LABBENZ NONE DETECTED 05/18/2022 1343   AMPHETMU NONE DETECTED 05/18/2022 1343   THCU NONE DETECTED 05/18/2022 1343   LABBARB NONE DETECTED 05/18/2022 1343    Alcohol Level     Component Value Date/Time   ETH <10 05/18/2022 1435   INR  Lab Results  Component Value Date   INR 1.0 05/18/2022   APTT  Lab Results  Component Value Date   APTT 28 05/18/2022   AED levels: No results found for: "PHENYTOIN", "ZONISAMIDE", "LAMOTRIGINE", "LEVETIRACETA"  CT Head without contrast(Personally reviewed): 1. No CT evidence of acute intracranial abnormality. 2. Remote appearing lacunar infarct in the left caudate head appears new since 2024. Additional remote lacunar infarct in the left pons is similar to prior. 3. ASPECTS is 10  ASSESSMENT   Dylan Burgess  is a 63 y.o. male with PMHx of HTN, anxiety, depression who presented to the ED from the library with  acute onset of left leg weakness and decree sensation in addition to subtle left mouth droop with onset around 11:45 this morning.  Patient's presentation is felt to be most consistent with an acute ischemic stroke and with further discussion from patient including risk, benefits, alternative treatments patient denied any contraindications to TNKase  and provided verbal consent for thrombolytic therapy.  RECOMMENDATIONS  Acute ischemic stroke determined by clinical assessment   Acuity: Acute Current Suspected Etiology: work up pending  Continue Evaluation:  -Admit to: ICU -Hold Aspirin  until 24 hour post tPA neuroimaging is stable and without evidence of bleeding -Blood pressure control, goal of SYS < 180/105 -MRI/ECHO/A1C/Lipid panel. -Hyperglycemia management per SSI to maintain glucose 140-180mg /dL. -PT/OT/ST therapies and recommendations when able  CNS Acute ischemic stroke -Close neuro monitoring -PT/OT  RESP Maintain SpO2 > 94% -supplemental oxygen as needed   CV Essential Hypertension -Aggressive BP control, goal SBP < 180/105 for 24 hours post-TNK -PRN labetalol, cleviprex as needed  Hyperlipidemia, unspecified  - Statin for goal LDL < 70  HEME AM CBC -Monitor H&H -Transfuse for hgb < 7  ENDO A1c pending  -goal HgbA1c < 7%  GI/GU -Gentle hydration via IVF while NPO  Fluid/Electrolyte Disorders AM CMP -Replete as needed -Repeat labs -Trend  ID Trend fever and WBC curve  Psychiatric History of anxiety/depression - Contiue Bupropion  and home ativan    ADHD - on home Stratrera   Prophylaxis DVT:  SCDs GI: PPI Bowel: Docusate / Senna  Diet: NPO until cleared by speech/bedside swallow  Code Status: Full Code    THE FOLLOWING WERE PRESENT ON ADMISSION: CNS -  Acute Ischemic Stroke ______________________________________________________________________  Signed,  Louretta Royals, NP [facility]  STROKE MD NOTE :  I have personally  obtained history,examined this patient, reviewed notes, independently viewed imaging studies, participated in medical decision making and plan of care.ROS completed by me personally and pertinent positives fully documented  I have made any additions or clarifications directly to the above note. Agree with note above.  Patient presented with sudden onset left lower extremity weakness and numbness likely from a small right subcortical infarct.  NIH is 3 on admission with significant left leg weakness which can be disabling.  Discussed risk benefits and alternatives with the patient and treatment with IV TNK including 4 to 6% risk of symptomatic hemorrhage I personally reviewed inclusion exclusion criteria the patient meets all of them.  Patient agreed with decision to go ahead with IV TNK which was administered uneventfully.  Recommend admission to neuro ICU for close neurological monitoring and strict blood pressure control as per postthrombolytic protocol.  Check CT angiogram of the brain and neck, MRI scan, echocardiogram, telemetry monitoring, lipid profile, hemoglobin A1c.  Physical occupational and speech therapy consults.  Discussed with Dr. Monnie Anthony ER MD. This patient is critically ill and at significant risk of neurological worsening, death and care requires constant monitoring of vital signs, hemodynamics,respiratory and cardiac monitoring, extensive review of multiple databases, frequent neurological assessment, discussion with family, other specialists and medical decision making of high complexity.I have made any additions or clarifications directly to the above note.This critical care time does not reflect procedure time, or teaching time or supervisory time of PA/NP/Med Resident etc but could involve care discussion time.  I spent 50 minutes of neurocritical care time  in the care of  this patient.  Ardella Beaver, MD Medical Director [facility] Pager: [phone] 01/08/2024  5:14 PM

## 2024-01-08 NOTE — ED Notes (Signed)
 Attempted to call report to 10M, state that the pt may be going to 18M and that someone would call back.

## 2024-01-08 NOTE — ED Triage Notes (Signed)
 Pt presents to ED via EMS with complaint of stroke like symptoms. 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 Pt also reports that he felt disoriented, oriented to all orientation questions. Pt reports having a headache 8/10 on the frontal area of the head that began at 8 am and has stayed the same but denies any other symptoms until 1145 as last known well.

## 2024-01-08 NOTE — ED Notes (Signed)
 Attempted to call report to 32M, states not available to take report at this time.

## 2024-01-09 ENCOUNTER — Inpatient Hospital Stay (HOSPITAL_COMMUNITY)

## 2024-01-09 DIAGNOSIS — R29700 NIHSS score 0: Secondary | ICD-10-CM | POA: Diagnosis not present

## 2024-01-09 DIAGNOSIS — I6389 Other cerebral infarction: Secondary | ICD-10-CM | POA: Diagnosis not present

## 2024-01-09 DIAGNOSIS — I10 Essential (primary) hypertension: Secondary | ICD-10-CM | POA: Diagnosis not present

## 2024-01-09 DIAGNOSIS — Z87891 Personal history of nicotine dependence: Secondary | ICD-10-CM

## 2024-01-09 DIAGNOSIS — I639 Cerebral infarction, unspecified: Secondary | ICD-10-CM | POA: Diagnosis not present

## 2024-01-09 DIAGNOSIS — E785 Hyperlipidemia, unspecified: Secondary | ICD-10-CM | POA: Diagnosis not present

## 2024-01-09 LAB — COMPREHENSIVE METABOLIC PANEL WITH GFR
ALT: 14 U/L (ref 0–44)
AST: 16 U/L (ref 15–41)
Albumin: 3.4 g/dL — ABNORMAL LOW (ref 3.5–5.0)
Alkaline Phosphatase: 64 U/L (ref 38–126)
Anion gap: 10 (ref 5–15)
BUN: 8 mg/dL (ref 6–20)
CO2: 25 mmol/L (ref 22–32)
Calcium: 8.5 mg/dL — ABNORMAL LOW (ref 8.9–10.3)
Chloride: 103 mmol/L (ref 98–111)
Creatinine, Ser: 0.98 mg/dL (ref 0.61–1.24)
GFR, Estimated: >60 mL/min (ref >60)
Glucose, Bld: 108 mg/dL — ABNORMAL HIGH (ref 70–99)
Potassium: 3.3 mmol/L — ABNORMAL LOW (ref 3.5–5.1)
Sodium: 138 mmol/L (ref 135–145)
Total Bilirubin: 0.8 mg/dL (ref 0.0–1.2)
Total Protein: 6.8 g/dL (ref 6.5–8.1)

## 2024-01-09 LAB — GLUCOSE, CAPILLARY
Glucose-Capillary: 101 mg/dL — ABNORMAL HIGH (ref 70–99)
Glucose-Capillary: 104 mg/dL — ABNORMAL HIGH (ref 70–99)
Glucose-Capillary: 152 mg/dL — ABNORMAL HIGH (ref 70–99)
Glucose-Capillary: 63 mg/dL — ABNORMAL LOW (ref 70–99)
Glucose-Capillary: 70 mg/dL (ref 70–99)

## 2024-01-09 LAB — CBC
HCT: 43.4 % (ref 39.0–52.0)
Hemoglobin: 14 g/dL (ref 13.0–17.0)
MCH: 26.3 pg (ref 26.0–34.0)
MCHC: 32.3 g/dL (ref 30.0–36.0)
MCV: 81.4 fL (ref 80.0–100.0)
Platelets: 267 10*3/uL (ref 150–400)
RBC: 5.33 MIL/uL (ref 4.22–5.81)
RDW: 14.1 % (ref 11.5–15.5)
WBC: 8.6 10*3/uL (ref 4.0–10.5)
nRBC: 0 % (ref 0.0–0.2)

## 2024-01-09 LAB — ECHOCARDIOGRAM COMPLETE
AR max vel: 4.19 cm2
AV Area VTI: 3.69 cm2
AV Area mean vel: 3.58 cm2
AV Mean grad: 3 mmHg
AV Peak grad: 5.9 mmHg
Ao pk vel: 1.21 m/s
Area-P 1/2: 3.87 cm2
Calc EF: 65.2 %
Height: 72 in
S' Lateral: 2.7 cm
Single Plane A2C EF: 70.1 %
Single Plane A4C EF: 59.7 %
Weight: 3156.99 [oz_av]

## 2024-01-09 LAB — LIPID PANEL
Cholesterol: 165 mg/dL (ref 0–200)
HDL: 41 mg/dL (ref >40)
LDL Cholesterol: 110 mg/dL — ABNORMAL HIGH (ref 0–99)
Total CHOL/HDL Ratio: 4 ratio
Triglycerides: 68 mg/dL (ref <150)
VLDL: 14 mg/dL (ref 0–40)

## 2024-01-09 LAB — MRSA NEXT GEN BY PCR, NASAL: MRSA by PCR Next Gen: NOT DETECTED

## 2024-01-09 LAB — HIV ANTIBODY (ROUTINE TESTING W REFLEX): HIV Screen 4th Generation wRfx: NONREACTIVE

## 2024-01-09 MED ORDER — ROSUVASTATIN CALCIUM 20 MG PO TABS
20.0000 mg | ORAL_TABLET | Freq: Every day | ORAL | Status: DC
  Administered 2024-01-09 – 2024-01-10 (×2): 20 mg via ORAL
  Filled 2024-01-09 (×2): qty 1

## 2024-01-09 MED ORDER — CLOPIDOGREL BISULFATE 75 MG PO TABS
75.0000 mg | ORAL_TABLET | Freq: Every day | ORAL | Status: DC
  Administered 2024-01-09 – 2024-01-10 (×2): 75 mg via ORAL
  Filled 2024-01-09 (×2): qty 1

## 2024-01-09 MED ORDER — ASPIRIN 81 MG PO TBEC
81.0000 mg | DELAYED_RELEASE_TABLET | Freq: Every day | ORAL | Status: DC
  Administered 2024-01-09 – 2024-01-10 (×2): 81 mg via ORAL
  Filled 2024-01-09 (×2): qty 1

## 2024-01-09 MED ORDER — BUPROPION HCL ER (XL) 150 MG PO TB24
150.0000 mg | ORAL_TABLET | Freq: Every morning | ORAL | Status: DC
  Administered 2024-01-09 – 2024-01-10 (×2): 150 mg via ORAL
  Filled 2024-01-09 (×2): qty 1

## 2024-01-09 MED ORDER — POTASSIUM CHLORIDE CRYS ER 20 MEQ PO TBCR
40.0000 meq | EXTENDED_RELEASE_TABLET | Freq: Once | ORAL | Status: AC
  Administered 2024-01-09: 40 meq via ORAL
  Filled 2024-01-09: qty 2

## 2024-01-09 MED ORDER — ENOXAPARIN SODIUM 40 MG/0.4ML IJ SOSY
40.0000 mg | PREFILLED_SYRINGE | INTRAMUSCULAR | Status: DC
  Administered 2024-01-09: 40 mg via SUBCUTANEOUS
  Filled 2024-01-09: qty 0.4

## 2024-01-09 NOTE — Evaluation (Signed)
 Physical Therapy Evaluation/ Discharge Patient Details Name: Dylan Burgess MRN: [id number] DOB: 1961-01-23 Today's Date: 01/09/2024  History of Present Illness  63 yo male admitted 4/25 with LLE heaviness and decreased sensation head CT negative, s/p TNK. PMhx: HTN, anxiety, depression  Clinical Impression  Pt pleasant, demonstrates mild weakness in hamstring and hip flexor but able to stand, walk, perform stairs, standing balance and bed mobility without assist. 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 Pt reports feeling back to baseline despite mild weakness. Pt lives alone and is appropriate to return home without further therapy needs at this time, pt aware and agreeable, will sign off.   Encouraged OOB and gait with staff.  BP 129/75 HR 76 96% on RA      If plan is discharge home, recommend the following:     Can travel by private vehicle        Equipment Recommendations None recommended by PT  Recommendations for Other Services       Functional Status Assessment Patient has not had a recent decline in their functional status     Precautions / Restrictions Precautions Precautions: None      Mobility  Bed Mobility Overal bed mobility: Modified Independent             General bed mobility comments: HOB 10 degrees    Transfers Overall transfer level: Needs assistance   Transfers: Sit to/from Stand Sit to Stand: Supervision           General transfer comment: supervision for lines only, no physical assist    Ambulation/Gait Ambulation/Gait assistance: Independent Gait Distance (Feet): 250 Feet Assistive device: None Gait Pattern/deviations: WFL(Within Functional Limits)   Gait velocity interpretation: 1.31 - 2.62 ft/sec, indicative of limited community ambulator      Stairs Stairs: Yes Stairs assistance: Modified independent (Device/Increase time) Stair Management: One rail Right, Alternating pattern, Forwards Number  of Stairs: 2 General stair comments: cues for sequence with LLE weakness, pt able to demonstrate  Wheelchair Mobility     Tilt Bed    Modified Rankin (Stroke Patients Only) Modified Rankin (Stroke Patients Only) Pre-Morbid Rankin Score: No symptoms Modified Rankin: No significant disability     Balance Overall balance assessment: No apparent balance deficits (not formally assessed)   Sitting balance-Leahy Scale: Normal       Standing balance-Leahy Scale: Normal   Single Leg Stance - Right Leg: 5 Single Leg Stance - Left Leg: 5 Tandem Stance - Right Leg: 10 Tandem Stance - Left Leg: 10   Rhomberg - Eyes Closed: 30                 Pertinent Vitals/Pain Pain Assessment Pain Assessment: No/denies pain    Home Living Family/patient expects to be discharged to:: Private residence Living Arrangements: Alone Available Help at Discharge: Friend(s);Available PRN/intermittently Type of Home: House Home Access: Stairs to enter   Entrance Stairs-Number of Steps: 2   Home Layout: One level Home Equipment: None      Prior Function Prior Level of Function : Independent/Modified Independent;Driving;Working/employed             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 Extremity Assessment: Overall WFL for tasks assessed    Lower Extremity Assessment Lower Extremity Assessment: LLE deficits/detail LLE Deficits / Details: 3/5 hip flexion and 4/5 knee flexion all other MMT 5/5 with sensation intact    Cervical /  Trunk Assessment Cervical / Trunk Assessment: Normal  Communication   Communication Communication: No apparent difficulties    Cognition Arousal: Alert Behavior During Therapy: WFL for tasks assessed/performed   PT - Cognitive impairments: No apparent impairments                         Following commands: Intact       Cueing Cueing Techniques: Verbal cues      General Comments      Exercises     Assessment/Plan    PT Assessment Patient does not need any further PT services  PT Problem List         PT Treatment Interventions      PT Goals (Current goals can be found in the Care Plan section)  Acute Rehab PT Goals PT Goal Formulation: All assessment and education complete, DC therapy    Frequency       Co-evaluation               AM-PAC PT "6 Clicks" Mobility  Outcome Measure Help needed turning from your back to your side while in a flat bed without using bedrails?: None Help needed moving from lying on your back to sitting on the side of a flat bed without using bedrails?: None Help needed moving to and from a bed to a chair (including a wheelchair)?: None Help needed standing up from a chair using your arms (e.g., wheelchair or bedside chair)?: None Help needed to walk in hospital room?: None Help needed climbing 3-5 steps with a railing? : None 6 Click Score: 24    End of Session Equipment Utilized During Treatment: Gait belt Activity Tolerance: Patient tolerated treatment well Patient left: in chair;with call bell/phone within reach Nurse Communication: Mobility status PT Visit Diagnosis: Other abnormalities of gait and mobility (R26.89)    Time: 4098-1191 PT Time Calculation (min) (ACUTE ONLY): 18 min   Charges:   PT Evaluation $PT Eval Low Complexity: 1 Low   PT General Charges $$ ACUTE PT VISIT: 1 Visit         Annis Baseman, PT Acute Rehabilitation Services Office: [phone]   Dylan Burgess 01/09/2024, 1:22 PM

## 2024-01-09 NOTE — Progress Notes (Signed)
 OT Cancellation Note  Patient Details Name: Dylan Burgess MRN: [id number] DOB: [birth year]   Cancelled Treatment:    Reason Eval/Treat Not Completed: Active bedrest order  Ladislaus Repsher K, OTD, OTR/L SecureChat Preferred Acute Rehab [phone]   Antionette Kirks 01/09/2024, 7:04 AM

## 2024-01-09 NOTE — Progress Notes (Addendum)
 STROKE TEAM PROGRESS NOTE    SIGNIFICANT HOSPITAL EVENTS  4/25: Presented as CODE STROKE d/t confusion, LLE heaviness and decreased sensation. Reported frontal headache in the AM, 8/10 pain. CTH negative.  TNK given 4/25 @ 1314.  INTERIM HISTORY/SUBJECTIVE  On exam, patient is sitting up in bed.  No focal neurological deficits seen.  Patient states he feels back to his baseline.  MRI/24-hour post TNK imaging is stable with no bleed seen.  Patient has been started on aspirin  and Plavix .  OBJECTIVE  CBC    Component Value Date/Time   WBC 8.6 01/09/2024 0407   RBC 5.33 01/09/2024 0407   HGB 14.0 01/09/2024 0407   HGB 15.3 04/21/2018 1150   HCT 43.4 01/09/2024 0407   HCT 45.6 04/21/2018 1150   PLT 267 01/09/2024 0407   PLT 297 04/21/2018 1150   MCV 81.4 01/09/2024 0407   MCV 85 04/21/2018 1150   MCH 26.3 01/09/2024 0407   MCHC 32.3 01/09/2024 0407   RDW 14.1 01/09/2024 0407   RDW 14.5 04/21/2018 1150   LYMPHSABS 1.4 01/08/2024 1306   LYMPHSABS 2.1 04/21/2018 1150   MONOABS 1.1 (H) 01/08/2024 1306   EOSABS 0.2 01/08/2024 1306   EOSABS 0.2 04/21/2018 1150   BASOSABS 0.0 01/08/2024 1306   BASOSABS 0.0 04/21/2018 1150    BMET    Component Value Date/Time   NA 138 01/09/2024 0407   NA 139 12/24/2022 1023   K 3.3 (L) 01/09/2024 0407   CL 103 01/09/2024 0407   CO2 25 01/09/2024 0407   GLUCOSE 108 (H) 01/09/2024 0407   BUN 8 01/09/2024 0407   BUN 12 12/24/2022 1023   CREATININE 0.98 01/09/2024 0407   CALCIUM  8.5 (L) 01/09/2024 0407   EGFR 83 12/24/2022 1023   GFRNONAA >60 01/09/2024 0407    IMAGING past 24 hours CT ANGIO HEAD NECK W WO CM Result Date: 01/08/2024 CLINICAL DATA:  Stroke/TIA, stroke-like symptoms including sudden onset left leg weakness and numbness and disorientation. Headache. EXAM: CT ANGIOGRAPHY HEAD AND NECK WITH AND WITHOUT CONTRAST TECHNIQUE: Multidetector CT imaging of the head and neck was performed using the standard protocol during bolus  administration of intravenous contrast. Multiplanar CT image reconstructions and MIPs were obtained to evaluate the vascular anatomy. Carotid stenosis measurements (when applicable) are obtained utilizing NASCET criteria, using the distal internal carotid diameter as the denominator. RADIATION DOSE REDUCTION: This exam was performed according to the departmental dose-optimization program which includes automated exposure control, adjustment of the mA and/or kV according to patient size and/or use of iterative reconstruction technique. CONTRAST:  75mL OMNIPAQUE  IOHEXOL  350 MG/ML SOLN COMPARISON:  Same-day head CT. FINDINGS: CTA NECK FINDINGS Aortic arch: Standard configuration of the aortic arch. Imaged portion shows no evidence of aneurysm or dissection. No significant stenosis of the major arch vessel origins. Pulmonary arteries: As permitted by contrast timing, there are no filling defects in the visualized pulmonary arteries. Subclavian arteries: The subclavian arteries are patent bilaterally. Right carotid system: No evidence of dissection, stenosis (50% or greater), or occlusion. Focal calcified atherosclerosis at the carotid bifurcation. Additional noncalcified soft tissue along the posterior wall of the carotid bulb which may reflect noncalcified atherosclerotic plaque versus carotid web. Additional minimal calcified atherosclerosis of the proximal cervical ICA without stenosis. Left carotid system: No evidence of dissection, stenosis (50% or greater), or occlusion. Mild atherosclerosis along the proximal cervical ICA without stenosis. Vertebral arteries: Codominant. No evidence of dissection, stenosis (50% or greater), or occlusion. Skeleton: No acute findings. Degenerative  changes in the cervical spine. Mild intervertebral disc space narrowing at multiple levels. Prominent anterior endplate osteophytes. Disc osteophyte complexes throughout the cervical spine. Moderate spinal canal stenosis at C5-6. Other  neck: The visualized airway is patent. No cervical lymphadenopathy. Upper chest: Visualized lung apices are clear. Review of the MIP images confirms the above findings CTA HEAD FINDINGS ANTERIOR CIRCULATION: The intracranial ICAs are patent bilaterally. No significant stenosis, proximal occlusion, aneurysm, or vascular malformation. MCAs: The middle cerebral arteries are patent bilaterally. ACAs: The anterior cerebral arteries are patent bilaterally. POSTERIOR CIRCULATION: No significant stenosis, proximal occlusion, aneurysm, or vascular malformation. PCAs: Patent bilaterally.  Fetal origin of the right PCA. Pcomm: The posterior communicating arteries are visualized bilaterally. SCAs: The superior cerebellar arteries are patent bilaterally. Basilar artery: Patent. Fenestrated appearance of the proximal basilar artery. AICAs: Visualized on the right. PICAs: Visualized on the left. Vertebral arteries: The intracranial vertebral arteries are patent. Venous sinuses: As permitted by contrast timing, patent. Anatomic variants: None Review of the MIP images confirms the above findings IMPRESSION: No large vessel occlusion. No aneurysm, high-grade stenosis, or vascular malformation of the arteries in the head and neck. Focal soft tissue along the posterior wall of the right carotid bulb which may reflect noncalcified atherosclerotic plaque versus carotid web. There is associated mild stenosis (less than 50%). Electronically Signed   By: Denny Flack M.D.   On: 01/08/2024 15:23   CT HEAD CODE STROKE WO CONTRAST Result Date: 01/08/2024 CLINICAL DATA:  Code stroke. Neuro deficit, left-sided weakness and numbness. EXAM: CT HEAD WITHOUT CONTRAST TECHNIQUE: Contiguous axial images were obtained from the base of the skull through the vertex without intravenous contrast. RADIATION DOSE REDUCTION: This exam was performed according to the departmental dose-optimization program which includes automated exposure control,  adjustment of the mA and/or kV according to patient size and/or use of iterative reconstruction technique. COMPARISON:  CT head 07/19/2023. FINDINGS: Brain: No acute intracranial hemorrhage. No CT evidence of acute infarct. Remote lacunar infarct in the left caudate head which may be new since 2024 but appears chronic. There is likely an additional remote lacunar infarct in the left pons. No edema, mass effect, or midline shift. The basilar cisterns are patent. Ventricles: The ventricles are normal. Vascular: No hyperdense vessel or unexpected calcification. Skull: No acute or aggressive finding. Orbits: Orbits are symmetric. 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 Other: Mastoid air cells are clear. ASPECTS [facility] Stroke Program Early CT Score) - Ganglionic level infarction (caudate, lentiform nuclei, internal capsule, insula, M1-M3 cortex): 7 - Supraganglionic infarction (M4-M6 cortex): 3 Total score (0-10 with 10 being normal): 10 IMPRESSION: 1. No CT evidence of acute intracranial abnormality. 2. Remote appearing lacunar infarct in the left caudate head appears new since 2024. Additional remote lacunar infarct in the left pons is similar to prior. 3. ASPECTS is 10 These results were communicated to Dr. Janett Medin At 1:26 pm on 01/08/2024 by text page via the [facility] messaging system. Electronically Signed   By: Denny Flack M.D.   On: 01/08/2024 13:30    Vitals:   01/09/24 0745 01/09/24 0800 01/09/24 0815 01/09/24 0830  BP: 123/83 122/61 136/61 137/75  Pulse: 63 69 70 62  Resp: 13 14 14 15   Temp:      TempSrc:      SpO2: 97% 98% 98% 96%  Weight:      Height:       PHYSICAL EXAM General:  Alert, well-nourished, well-developed patient in no acute distress Psych:  Calm  and cooperative CV: Regular rate and rhythm on monitor Respiratory:  Regular, unlabored respirations on room air  NEURO:  Mental Status: AA&Ox3, patient is able to give clear and coherent history Speech/Language: speech is  without dysarthria or aphasia.  Naming, repetition, fluency, and comprehension intact.  Cranial Nerves:  II: PERRL. Visual fields full.  III, IV, VI: EOMI. Eyelids elevate symmetrically.  V: Sensation is intact to light touch and symmetrical to face.  VII: Face is symmetrical resting and smiling VIII: hearing intact to voice. IX, X: Palate elevates symmetrically. Phonation is normal.  WU:[id number] shrug 5/5. XII: tongue is midline without fasciculations. Motor: 5/5 strength to all muscle groups tested.  Tone: is normal and bulk is normal Sensation- Intact to light touch bilaterally. Extinction absent to light touch to DSS.   Coordination: FTN intact bilaterally, HKS: no ataxia in BLE.No drift.  Gait- deferred  Most Recent NIH: 0    ASSESSMENT/PLAN  Dylan Burgess is a 63 y.o. male with PMHx of HTN, anxiety, depression who presented to the ED with acute onset of left leg weakness and decreased sensation in addition to subtle left mouth droop.  Patient's presentation is felt to be most consistent with an acute ischemic stroke.TNK administered, admitted for post-TNK monitoring and full stroke work-up.  NIH on Admission: 3.  Stroke aborted by TNK vs. TIA vs. anxiety Code Stroke CT head No acute abnormality.  Remote left caudate lacunar infarct, appears new since 2024. Remote left pons lacunar infarct. ASPECTS 10.    CTA head & neck No LVO, No aneurysm, high-grade stenosis, or vascular malformation of the arteries in the head and neck. Right carotid smooth athero, not likely carotid web.  MRI: No acute intracranial abnormality. 2D Echo: LVEF 60 to 65%, moderate LVH LDL 110 HgbA1c 6.5 UDS neg VTE prophylaxis lovenox subq No antithrombotic prior to admission, now on Aspirin  and Plavix  for 3 weeks, followed by Aspirin  alone.  Therapy recommendations:  No follow-up needed Disposition: likely home tomorrow.   Hypertension Home meds:  norvasc  5mg  daily, losartan  50mg  daily, propanolol  10mg  BID Stable Long term BP goal normotensive  Hyperlipidemia Home meds:  Crestor  5mg  LDL 110, goal < 70 Now on Crestor  20mg   Continue statin at discharge  Other stroke risk factors Patient is a former smoker  Other Active Problems Hypokalemia K 3.3, replenished, recheck in AM.  Anxiety/Depression Wellbutrin  home medication, continue  Hospital day # 1   Pt seen by Neuro NP/APP and later by MD. Note/plan to be edited by MD as needed.    Audrene Lease, DNP, AGACNP-BC [facility] Please use AMION for contact information & EPIC for messaging.  ATTENDING NOTE: I reviewed above note and agree with the assessment and plan. Pt was seen and examined.   No family at the bedside. Pt lying in bed, neuro intact. Pt stated that his symptoms has resolved since he arrived in ICU yesterday. MRI at 24h of TNK showed no acute infarct. CTA negative LVO, reported right ICA smooth athero, not likely carotid web. Now on DAPT and statin, PT and OT no recs.   For detailed assessment and plan, please refer to above as I have made changes wherever appropriate.   Consuelo Denmark, MD PhD Stroke Neurology 01/09/2024 9:42 PM  This patient is critically ill due to stroke like symptoms s/p TNK and at significant risk of neurological worsening, death form bleeding from TNK. This patient's care requires constant monitoring of vital signs, hemodynamics, respiratory and cardiac monitoring, review  of multiple databases, neurological assessment, discussion with family, other specialists and medical decision making of high complexity. I spent 30 minutes of neurocritical care time in the care of this patient.     To contact Stroke Continuity provider, please refer to [email or web address]. After hours, contact General Neurology

## 2024-01-09 NOTE — Plan of Care (Signed)

## 2024-01-09 NOTE — Plan of Care (Signed)
  Problem: Education: Goal: Knowledge of disease or condition will improve Outcome: Progressing   Problem: Coping: Goal: Will verbalize positive feelings about self Outcome: Progressing Goal: Will identify appropriate support needs Outcome: Progressing   Problem: Self-Care: Goal: Verbalization of feelings and concerns over difficulty with self-care will improve Outcome: Progressing Goal: Ability to communicate needs accurately will improve Outcome: Progressing

## 2024-01-09 NOTE — Progress Notes (Signed)
 PT Cancellation Note  Patient Details Name: Dylan Burgess MRN: [id number] DOB: 03-Nov-1960   Cancelled Treatment:    Reason Eval/Treat Not Completed: Active bedrest order   [name] 01/09/2024, 7:04 AM Annis Baseman, PT Acute Rehabilitation Services Office: [phone]

## 2024-01-09 NOTE — Progress Notes (Signed)
  Echocardiogram 2D Echocardiogram has been performed.  Annis Kinder, RDCS 01/09/2024, 11:32 AM

## 2024-01-10 DIAGNOSIS — I639 Cerebral infarction, unspecified: Secondary | ICD-10-CM | POA: Diagnosis not present

## 2024-01-10 DIAGNOSIS — F32A Depression, unspecified: Secondary | ICD-10-CM | POA: Insufficient documentation

## 2024-01-10 DIAGNOSIS — R29700 NIHSS score 0: Secondary | ICD-10-CM | POA: Diagnosis not present

## 2024-01-10 DIAGNOSIS — F411 Generalized anxiety disorder: Secondary | ICD-10-CM | POA: Insufficient documentation

## 2024-01-10 DIAGNOSIS — R2990 Unspecified symptoms and signs involving the nervous system: Secondary | ICD-10-CM | POA: Insufficient documentation

## 2024-01-10 MED ORDER — ROSUVASTATIN CALCIUM 20 MG PO TABS: 20.0000 mg | ORAL_TABLET | Freq: Every day | ORAL | 0 refills | Status: DC

## 2024-01-10 MED ORDER — CLOPIDOGREL BISULFATE 75 MG PO TABS
75.0000 mg | ORAL_TABLET | Freq: Every day | ORAL | 0 refills | Status: DC
Start: 2024-01-11 — End: 2024-04-06

## 2024-01-10 MED ORDER — PANTOPRAZOLE SODIUM 40 MG PO TBEC: 40.0000 mg | DELAYED_RELEASE_TABLET | Freq: Every day | ORAL | Status: DC

## 2024-01-10 MED ORDER — ASPIRIN 81 MG PO TBEC
81.0000 mg | DELAYED_RELEASE_TABLET | Freq: Every day | ORAL | 12 refills | Status: AC
Start: 2024-01-11 — End: ?

## 2024-01-10 NOTE — Progress Notes (Signed)
 SLP Cancellation Note  Patient Details Name: Dylan Burgess MRN: [id number] DOB: 05/27/1961   Cancelled treatment:       Reason Eval/Treat Not Completed: SLP screened, no needs identified, will sign off Pt did not present with any acute speech, language, or cognitive-linguistic deficits on admission, MRI was negative for acute changes, and no overt communication or cognitive-linguistic deficits were noted by physical therapy or nursing. A formal speech-language-cognition evaluation does not appear to be clinically indicated at this time. SLP will sign off.  Dylan Burgess I. Dylan Garnet, MS, CCC-SLP Acute Rehabilitation Services Office number [phone]  Dylan Burgess 01/10/2024, 9:36 AM

## 2024-01-10 NOTE — Evaluation (Signed)
 Occupational Therapy Evaluation Patient Details Name: Dylan Burgess MRN: [id number] DOB: 02/03/1961 Today's Date: 01/10/2024   History of Present Illness   64 yo male admitted 4/25 with LLE heaviness and decreased sensation head CT negative, s/p TNK. PMhx: HTN, anxiety, depression     Clinical Impressions Pt reports ind at baseline with ADL/functional mobility, lives alone but reports having PRN assist from friends. Pt currently reports mild LLE weakness, but overall needs supervision for ADLs, ind with bed mobility and supervision for transfers without AD. Pt educated on BEFAST and verbalized understanding. Pt presenting with impairments listed below, however has no acute OT needs at this time, will s/o. Please reconsult if there is a change in pt status.      If plan is discharge home, recommend the following:   Assist for transportation     Functional Status Assessment   Patient has had a recent decline in their functional status and demonstrates the ability to make significant improvements in function in a reasonable and predictable amount of time.     Equipment Recommendations   None recommended by OT     Recommendations for Other Services         Precautions/Restrictions   Precautions Precautions: None Restrictions Weight Bearing Restrictions Per Provider Order: No     Mobility Bed Mobility Overal bed mobility: Independent                  Transfers Overall transfer level: Needs assistance   Transfers: Sit to/from Stand Sit to Stand: Supervision           General transfer comment: supervision for lines only, no physical assist      Balance Overall balance assessment: Mild deficits observed, not formally tested                                         ADL either performed or assessed with clinical judgement   ADL Overall ADL's : Needs assistance/impaired Eating/Feeding: Set up;Sitting   Grooming: Set  up;Sitting   Upper Body Bathing: Supervision/ safety   Lower Body Bathing: Supervison/ safety   Upper Body Dressing : Supervision/safety   Lower Body Dressing: Supervision/safety   Toilet Transfer: Supervision/safety   Toileting- Clothing Manipulation and Hygiene: Supervision/safety       Functional mobility during ADLs: Supervision/safety       Vision Baseline Vision/History: 1 Wears glasses Vision Assessment?: No apparent visual deficits Additional Comments: able to find/read s/s of stroke in stroke book     Perception Perception: Not tested       Praxis Praxis: Not tested       Pertinent Vitals/Pain Pain Assessment Pain Assessment: No/denies pain     Extremity/Trunk Assessment Upper Extremity Assessment Upper Extremity Assessment: Overall WFL for tasks assessed   Lower Extremity Assessment Lower Extremity Assessment: Defer to PT evaluation (LLE weaker than RLE)   Cervical / Trunk Assessment Cervical / Trunk Assessment: Normal   Communication Communication Communication: No apparent difficulties   Cognition Arousal: Alert Behavior During Therapy: WFL for tasks assessed/performed Cognition: No apparent impairments                               Following commands: Intact       Cueing  General Comments   Cueing Techniques: Verbal cues  VSS on RA   Exercises  Shoulder Instructions      Home Living Family/patient expects to be discharged to:: Private residence Living Arrangements: Alone Available Help at Discharge: Friend(s);Available PRN/intermittently Type of Home: House Home Access: Stairs to enter [employer] of Steps: 2   Home Layout: One level     Bathroom Shower/Tub: Chief Strategy Officer: Standard     Home Equipment: None          Prior Functioning/Environment Prior Level of Function : Independent/Modified Independent;Driving;Working/employed             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 ADLs Comments: ind with ADL    OT Problem List: Impaired balance (sitting and/or standing)   OT Treatment/Interventions:        OT Goals(Current goals can be found in the care plan section)   Acute Rehab OT Goals Patient Stated Goal: none stated OT Goal Formulation: With patient Time For Goal Achievement: 01/24/24 Potential to Achieve Goals: Good   OT Frequency:       Co-evaluation              AM-PAC OT "6 Clicks" Daily Activity     Outcome Measure Help from another person eating meals?: None Help from another person taking care of personal grooming?: None Help from another person toileting, which includes using toliet, bedpan, or urinal?: None Help from another person bathing (including washing, rinsing, drying)?: A Little Help from another person to put on and taking off regular upper body clothing?: None Help from another person to put on and taking off regular lower body clothing?: A Little 6 Click Score: 22   End of Session Nurse Communication: Mobility status  Activity Tolerance: Patient tolerated treatment well Patient left: in bed;with call bell/phone within reach  OT Visit Diagnosis: Unsteadiness on feet (R26.81);Other abnormalities of gait and mobility (R26.89);Muscle weakness (generalized) (M62.81)                Time: 9147-8295 OT Time Calculation (min): 14 min Charges:  OT General Charges $OT Visit: 1 Visit OT Evaluation $OT Eval Low Complexity: 1 Low  Ashleigh Luckow K, OTD, OTR/L SecureChat Preferred Acute Rehab [phone]   Ott Zimmerle K Koonce 01/10/2024, 11:10 AM

## 2024-01-10 NOTE — Plan of Care (Signed)
  Problem: Education: Goal: Knowledge of disease or condition will improve Outcome: Progressing   Problem: Coping: Goal: Will verbalize positive feelings about self Outcome: Progressing   Problem: Health Behavior/Discharge Planning: Goal: Ability to manage health-related needs will improve Outcome: Progressing Goal: Goals will be collaboratively established with patient/family Outcome: Progressing   Problem: Self-Care: Goal: Ability to participate in self-care as condition permits will improve Outcome: Progressing

## 2024-01-10 NOTE — Progress Notes (Signed)
 Dylan Burgess to be D/C'd Home per MD order.  Discussed with the patient and all questions fully answered.  VSS, Skin clean, dry and intact without evidence of skin break down, no evidence of skin tears noted. IV catheter discontinued intact. Site without signs and symptoms of complications. Dressing and pressure applied.  An After Visit Summary was printed and given to the patient. Patient received prescription.  D/c education completed with patient/family including follow up instructions, medication list, d/c activities limitations if indicated, with other d/c instructions as indicated by MD - patient able to verbalize understanding, all questions fully answered.   Patient instructed to return to ED, call 911, or call MD for any changes in condition.   Patient escorted via WC, and D/C home.   Dylan Burgess [facility] 01/10/2024 12:58 PM

## 2024-01-10 NOTE — Discharge Summary (Addendum)
 Stroke Discharge Summary  Patient ID: Dylan Burgess   MRN: [id number]      DOB: 30-Oct-1960  Date of Admission: 01/08/2024 Date of Discharge: 01/10/2024  Attending Physician:  Stroke, Md, MD, Stroke MD Patient's PCP:  Joaquin Mulberry, MD  DISCHARGE DIAGNOSIS:  Stroke aborted by TNK vs. TIA vs. anxiety   Secondary diagnosis Hypertension Hyperlipidemia Anxiety  Allergies as of 01/10/2024   No Known Allergies      Medication List     STOP taking these medications    ibuprofen  800 MG tablet Commonly known as: ADVIL    propranolol  10 MG tablet Commonly known as: INDERAL        TAKE these medications    amLODipine  5 MG tablet Commonly known as: NORVASC  Take 1 tablet (5 mg total) by mouth daily.   aspirin  EC 81 MG tablet Take 1 tablet (81 mg total) by mouth daily. Swallow whole. Start taking on: January 11, 2024   atomoxetine  40 MG capsule Commonly known as: STRATTERA  Take 1 capsule (40 mg total) by mouth daily. What changed: Another medication with the same name was removed. Continue taking this medication, and follow the directions you see here.   buPROPion  150 MG 24 hr tablet Commonly known as: Wellbutrin  XL Take 1 tablet (150 mg total) by mouth every morning. What changed: Another medication with the same name was removed. Continue taking this medication, and follow the directions you see here.   clopidogrel  75 MG tablet Commonly known as: PLAVIX  Take 1 tablet (75 mg total) by mouth daily. Start taking on: January 11, 2024   famotidine  40 MG tablet Commonly known as: Pepcid  Take 1 tablet (40 mg total) by mouth daily.   LORazepam  0.5 MG tablet Commonly known as: Ativan  Take 0.5-1 tablets (0.25-0.5 mg total) by mouth 2 (two) times daily as needed. What changed: Another medication with the same name was removed. Continue taking this medication, and follow the directions you see here.   losartan  50 MG tablet Commonly known as: COZAAR  Take 1 tablet (50  mg total) by mouth daily.   rosuvastatin  20 MG tablet Commonly known as: CRESTOR  Take 1 tablet (20 mg total) by mouth daily. Start taking on: January 11, 2024 What changed:  medication strength how much to take        LABORATORY STUDIES CBC    Component Value Date/Time   WBC 8.6 01/09/2024 0407   RBC 5.33 01/09/2024 0407   HGB 14.0 01/09/2024 0407   HGB 15.3 04/21/2018 1150   HCT 43.4 01/09/2024 0407   HCT 45.6 04/21/2018 1150   PLT 267 01/09/2024 0407   PLT 297 04/21/2018 1150   MCV 81.4 01/09/2024 0407   MCV 85 04/21/2018 1150   MCH 26.3 01/09/2024 0407   MCHC 32.3 01/09/2024 0407   RDW 14.1 01/09/2024 0407   RDW 14.5 04/21/2018 1150   LYMPHSABS 1.4 01/08/2024 1306   LYMPHSABS 2.1 04/21/2018 1150   MONOABS 1.1 (H) 01/08/2024 1306   EOSABS 0.2 01/08/2024 1306   EOSABS 0.2 04/21/2018 1150   BASOSABS 0.0 01/08/2024 1306   BASOSABS 0.0 04/21/2018 1150   CMP    Component Value Date/Time   NA 138 01/09/2024 0407   NA 139 12/24/2022 1023   K 3.3 (L) 01/09/2024 0407   CL 103 01/09/2024 0407   CO2 25 01/09/2024 0407   GLUCOSE 108 (H) 01/09/2024 0407   BUN 8 01/09/2024 0407   BUN 12 12/24/2022 1023   CREATININE 0.98 01/09/2024 0407  CALCIUM  8.5 (L) 01/09/2024 0407   PROT 6.8 01/09/2024 0407   PROT 7.3 12/24/2022 1023   ALBUMIN 3.4 (L) 01/09/2024 0407   ALBUMIN 4.4 12/24/2022 1023   AST 16 01/09/2024 0407   ALT 14 01/09/2024 0407   ALKPHOS 64 01/09/2024 0407   BILITOT 0.8 01/09/2024 0407   BILITOT 0.3 12/24/2022 1023   GFRNONAA >60 01/09/2024 0407   GFRAA >60 03/10/2020 2339   COAGS Lab Results  Component Value Date   INR 1.1 01/08/2024   INR 1.0 05/18/2022   INR 0.97 12/01/2017   Lipid Panel    Component Value Date/Time   CHOL 165 01/09/2024 0407   CHOL 204 (H) 12/24/2022 1023   TRIG 68 01/09/2024 0407   HDL 41 01/09/2024 0407   HDL 44 12/24/2022 1023   CHOLHDL 4.0 01/09/2024 0407   VLDL 14 01/09/2024 0407   LDLCALC 110 (H) 01/09/2024 0407    LDLCALC 144 (H) 12/24/2022 1023   HgbA1C  Lab Results  Component Value Date   HGBA1C 6.5 (H) 01/08/2024   Urinalysis    Component Value Date/Time   COLORURINE COLORLESS (A) 11/15/2022 0530   APPEARANCEUR CLEAR 11/15/2022 0530   LABSPEC 1.002 (L) 11/15/2022 0530   PHURINE 7.0 11/15/2022 0530   GLUCOSEU NEGATIVE 11/15/2022 0530   HGBUR SMALL (A) 11/15/2022 0530   HGBUR negative 02/08/2009 0909   BILIRUBINUR NEGATIVE 11/15/2022 0530   KETONESUR NEGATIVE 11/15/2022 0530   PROTEINUR NEGATIVE 11/15/2022 0530   UROBILINOGEN 0.2 02/08/2009 0909   NITRITE NEGATIVE 11/15/2022 0530   LEUKOCYTESUR NEGATIVE 11/15/2022 0530   Urine Drug Screen     Component Value Date/Time   LABOPIA NONE DETECTED 01/08/2024 1350   COCAINSCRNUR NONE DETECTED 01/08/2024 1350   LABBENZ NONE DETECTED 01/08/2024 1350   AMPHETMU NONE DETECTED 01/08/2024 1350   THCU NONE DETECTED 01/08/2024 1350   LABBARB NONE DETECTED 01/08/2024 1350    Alcohol Level    Component Value Date/Time   [facility] <15 01/08/2024 1306     SIGNIFICANT DIAGNOSTIC STUDIES MR BRAIN WO CONTRAST Result Date: 01/09/2024 CLINICAL DATA:  63 year old male with neurologic deficit, code stroke presentation yesterday. Status post T NK. EXAM: MRI HEAD WITHOUT CONTRAST TECHNIQUE: Multiplanar, multiecho pulse sequences of the brain and surrounding structures were obtained without intravenous contrast. COMPARISON:  CT head, CTA head and neck yesterday. FINDINGS: Brain: Cerebral volume is within normal limits for age. No restricted diffusion to suggest acute infarction. No midline shift, mass effect, evidence of mass lesion, ventriculomegaly, extra-axial collection or acute intracranial hemorrhage. Cervicomedullary junction and pituitary are within normal limits. Martina Sledge and white matter signal is within normal limits for age throughout the brain. No convincing caudate, basal ganglia lacunar infarct (series 6, image 16). No cortical encephalomalacia or definite  chronic cerebral blood products. 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 Vascular: Major intracranial vascular flow voids are preserved. Skull and upper cervical spine: Partially visible cervical spine degeneration. Background bone marrow signal is within normal limits. Sinuses/Orbits: Negative. Other: Mastoids are well aerated. Grossly normal visible internal auditory structures. Negative visible scalp and face. IMPRESSION: No acute intracranial abnormality. Normal for age noncontrast MRI appearance of the Brain. Electronically Signed   By: Marlise Simpers M.D.   On: 01/09/2024 16:45   ECHOCARDIOGRAM COMPLETE Result Date: 01/09/2024    ECHOCARDIOGRAM REPORT   Patient Name:   Jayzon Blakenship Date of Exam: 01/09/2024 Medical Rec #:  [id number]  Height:       72.0 in Accession #:    [id number]       Weight:       197.3 lb Date of Birth:  April 13, 1961       BSA:          2.118 m Patient Age:    62 years         BP:           131/87 mmHg Patient Gender: M                HR:           67 bpm. Exam Location:  Inpatient Procedure: 2D Echo, Cardiac Doppler and Color Doppler (Both Spectral and Color            Flow Doppler were utilized during procedure). Indications:    Stroke  History:        Patient has no prior history of Echocardiogram examinations.                 Stroke; Risk Factors:Hypertension.  Sonographer:    Andrena Bang Referring Phys: Genevieve Kerry [facility] IMPRESSIONS  1. Left ventricular ejection fraction, by estimation, is 60 to 65%. The left ventricle has normal function. The left ventricle has no regional wall motion abnormalities. There is moderate left ventricular hypertrophy. Left ventricular diastolic parameters are consistent with Grade I diastolic dysfunction (impaired relaxation).  2. Right ventricular systolic function is normal. The right ventricular size is normal.  3. The mitral valve is normal in structure. Trivial mitral valve regurgitation.  No evidence of mitral stenosis.  4. The aortic valve is normal in structure. Aortic valve regurgitation is not visualized. No aortic stenosis is present.  5. The inferior vena cava is normal in size with greater than 50% respiratory variability, suggesting right atrial pressure of 3 mmHg. Conclusion(s)/Recommendation(s): No intracardiac source of embolism detected on this transthoracic study. Consider a transesophageal echocardiogram to exclude cardiac source of embolism if clinically indicated. FINDINGS  Left Ventricle: Left ventricular ejection fraction, by estimation, is 60 to 65%. The left ventricle has normal function. The left ventricle has no regional wall motion abnormalities. The left ventricular internal cavity size was normal in size. There is  moderate left ventricular hypertrophy. Left ventricular diastolic parameters are consistent with Grade I diastolic dysfunction (impaired relaxation). Right Ventricle: The right ventricular size is normal. No increase in right ventricular wall thickness. Right ventricular systolic function is normal. Left Atrium: Left atrial size was normal in size. Right Atrium: Right atrial size was normal in size. Pericardium: There is no evidence of pericardial effusion. Mitral Valve: The mitral valve is normal in structure. Trivial mitral valve regurgitation. No evidence of mitral valve stenosis. Tricuspid Valve: The tricuspid valve is normal in structure. Tricuspid valve regurgitation is not demonstrated. No evidence of tricuspid stenosis. Aortic Valve: The aortic valve is normal in structure. Aortic valve regurgitation is not visualized. No aortic stenosis is present. Aortic valve mean gradient measures 3.0 mmHg. Aortic valve peak gradient measures 5.9 mmHg. Aortic valve area, by VTI measures 3.69 cm. Pulmonic Valve: The pulmonic valve was normal in structure. Pulmonic valve regurgitation is not visualized. No evidence of pulmonic stenosis. Aorta: The aortic root is normal  in size and structure. Venous: The inferior vena cava is normal in size with greater than 50% respiratory variability, suggesting right atrial pressure of 3 mmHg. IAS/Shunts: No atrial level shunt detected by color flow Doppler.  LEFT VENTRICLE PLAX 2D  LVIDd:         4.40 cm      Diastology LVIDs:         2.70 cm      LV e' medial:    9.57 cm/s LV PW:         1.20 cm      LV E/e' medial:  8.2 LV IVS:        1.40 cm      LV e' lateral:   12.10 cm/s LVOT diam:     2.40 cm      LV E/e' lateral: 6.5 LV SV:         90 LV SV Index:   42 LVOT Area:     4.52 cm  LV Volumes (MOD) LV vol d, MOD A2C: 114.0 ml LV vol d, MOD A4C: 110.0 ml LV vol s, MOD A2C: 34.1 ml LV vol s, MOD A4C: 44.3 ml LV SV MOD A2C:     79.9 ml LV SV MOD A4C:     110.0 ml LV SV MOD BP:      74.1 ml RIGHT VENTRICLE RV S prime:     12.80 cm/s TAPSE (M-mode): 3.2 cm LEFT ATRIUM             Index LA diam:        3.40 cm 1.61 cm/m LA Vol (A2C):   82.0 ml 38.71 ml/m LA Vol (A4C):   20.7 ml 9.77 ml/m LA Biplane Vol: 43.4 ml 20.49 ml/m  AORTIC VALVE AV Area (Vmax):    4.19 cm AV Area (Vmean):   3.58 cm AV Area (VTI):     3.69 cm AV Vmax:           121.00 cm/s AV Vmean:          84.700 cm/s AV VTI:            0.243 m AV Peak Grad:      5.9 mmHg AV Mean Grad:      3.0 mmHg LVOT Vmax:         112.00 cm/s LVOT Vmean:        67.100 cm/s LVOT VTI:          0.198 m LVOT/AV VTI ratio: 0.81  AORTA Ao Asc diam: 4.00 cm MITRAL VALVE MV Area (PHT): 3.87 cm     SHUNTS MV Decel Time: 196 msec     Systemic VTI:  0.20 m MV E velocity: 78.60 cm/s   Systemic Diam: 2.40 cm MV A velocity: 106.00 cm/s MV E/A ratio:  0.74 Dorothye Gathers MD Electronically signed by Dorothye Gathers MD Signature Date/Time: 01/09/2024/1:11:45 PM    Final    CT ANGIO HEAD NECK W WO CM Result Date: 01/08/2024 CLINICAL DATA:  Stroke/TIA, stroke-like symptoms including sudden onset left leg weakness and numbness and disorientation. Headache. EXAM: CT ANGIOGRAPHY HEAD AND NECK WITH AND WITHOUT CONTRAST  TECHNIQUE: Multidetector CT imaging of the head and neck was performed using the standard protocol during bolus administration of intravenous contrast. Multiplanar CT image reconstructions and MIPs were obtained to evaluate the vascular anatomy. Carotid stenosis measurements (when applicable) are obtained utilizing NASCET criteria, using the distal internal carotid diameter as the denominator. RADIATION DOSE REDUCTION: This exam was performed according to the departmental dose-optimization program which includes automated exposure control, adjustment of the mA and/or kV according to patient size and/or use of iterative reconstruction technique. CONTRAST:  75mL OMNIPAQUE  IOHEXOL  350 MG/ML SOLN COMPARISON:  Same-day head CT.  FINDINGS: CTA NECK FINDINGS Aortic arch: Standard configuration of the aortic arch. Imaged portion shows no evidence of aneurysm or dissection. No significant stenosis of the major arch vessel origins. Pulmonary arteries: As permitted by contrast timing, there are no filling defects in the visualized pulmonary arteries. Subclavian arteries: The subclavian arteries are patent bilaterally. Right carotid system: No evidence of dissection, stenosis (50% or greater), or occlusion. Focal calcified atherosclerosis at the carotid bifurcation. Additional noncalcified soft tissue along the posterior wall of the carotid bulb which may reflect noncalcified atherosclerotic plaque versus carotid web. Additional minimal calcified atherosclerosis of the proximal cervical ICA without stenosis. Left carotid system: No evidence of dissection, stenosis (50% or greater), or occlusion. Mild atherosclerosis along the proximal cervical ICA without stenosis. Vertebral arteries: Codominant. No evidence of dissection, stenosis (50% or greater), or occlusion. Skeleton: No acute findings. Degenerative changes in the cervical spine. Mild intervertebral disc space narrowing at multiple levels. Prominent anterior endplate  osteophytes. Disc osteophyte complexes throughout the cervical spine. Moderate spinal canal stenosis at C5-6. Other neck: The visualized airway is patent. No cervical lymphadenopathy. Upper chest: Visualized lung apices are clear. Review of the MIP images confirms the above findings CTA HEAD FINDINGS ANTERIOR CIRCULATION: The intracranial ICAs are patent bilaterally. No significant stenosis, proximal occlusion, aneurysm, or vascular malformation. MCAs: The middle cerebral arteries are patent bilaterally. ACAs: The anterior cerebral arteries are patent bilaterally. POSTERIOR CIRCULATION: No significant stenosis, proximal occlusion, aneurysm, or vascular malformation. PCAs: Patent bilaterally.  Fetal origin of the right PCA. Pcomm: The posterior communicating arteries are visualized bilaterally. SCAs: The superior cerebellar arteries are patent bilaterally. Basilar artery: Patent. Fenestrated appearance of the proximal basilar artery. AICAs: Visualized on the right. PICAs: Visualized on the left. Vertebral arteries: The intracranial vertebral arteries are patent. Venous sinuses: As permitted by contrast timing, patent. Anatomic variants: None Review of the MIP images confirms the above findings IMPRESSION: No large vessel occlusion. No aneurysm, high-grade stenosis, or vascular malformation of the arteries in the head and neck. Focal soft tissue along the posterior wall of the right carotid bulb which may reflect noncalcified atherosclerotic plaque versus carotid web. There is associated mild stenosis (less than 50%). Electronically Signed   By: Denny Flack M.D.   On: 01/08/2024 15:23   CT HEAD CODE STROKE WO CONTRAST Result Date: 01/08/2024 CLINICAL DATA:  Code stroke. Neuro deficit, left-sided weakness and numbness. EXAM: CT HEAD WITHOUT CONTRAST TECHNIQUE: Contiguous axial images were obtained from the base of the skull through the vertex without intravenous contrast. RADIATION DOSE REDUCTION: This exam was  performed according to the departmental dose-optimization program which includes automated exposure control, adjustment of the mA and/or kV according to patient size and/or use of iterative reconstruction technique. COMPARISON:  CT head 07/19/2023. FINDINGS: Brain: No acute intracranial hemorrhage. No CT evidence of acute infarct. Remote lacunar infarct in the left caudate head which may be new since 2024 but appears chronic. There is likely an additional remote lacunar infarct in the left pons. No edema, mass effect, or midline shift. The basilar cisterns are patent. Ventricles: The ventricles are normal. Vascular: No hyperdense vessel or unexpected calcification. Skull: No acute or aggressive finding. Orbits: Orbits are symmetric. 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 Other: Mastoid air cells are clear. ASPECTS [facility] Stroke Program Early CT Score) - Ganglionic level infarction (caudate, lentiform nuclei, internal capsule, insula, M1-M3 cortex): 7 - Supraganglionic infarction (M4-M6 cortex): 3 Total score (0-10 with 10 being normal): 10 IMPRESSION: 1. No CT evidence  of acute intracranial abnormality. 2. Remote appearing lacunar infarct in the left caudate head appears new since 2024. Additional remote lacunar infarct in the left pons is similar to prior. 3. ASPECTS is 10 These results were communicated to Dr. Janett Medin At 1:26 pm on 01/08/2024 by text page via the [facility] messaging system. Electronically Signed   By: Denny Flack M.D.   On: 01/08/2024 13:30      HISTORY OF PRESENT ILLNESS 63 y.o. male with PMHx of HTN, anxiety, depression who presented to the ED with acute onset of left leg weakness and decreased sensation in addition to subtle left mouth droop.  Patient's presentation is felt to be most consistent with an acute ischemic stroke.TNK administered, admitted for post-TNK monitoring and full stroke work-up.  NIH on Admission: 3.    HOSPITAL COURSE Stroke aborted by TNK vs. TIA vs.  anxiety Code Stroke CT head No acute abnormality.  Remote left caudate lacunar infarct, appears new since 2024. Remote left pons lacunar infarct. ASPECTS 10.    CTA head & neck No LVO, No aneurysm, high-grade stenosis, or vascular malformation of the arteries in the head and neck. Right carotid smooth athero, not likely carotid web.  MRI: No acute intracranial abnormality. 2D Echo: LVEF 60 to 65%, moderate LVH LDL 110 HgbA1c 6.5 UDS neg VTE prophylaxis lovenox subq No antithrombotic prior to admission, now on Aspirin  and Plavix  for 3 weeks, followed by Aspirin  alone.  Therapy recommendations:  No follow-up needed Disposition: home today   Hypertension Home meds:  norvasc  5mg  daily, losartan  50mg  daily, propanolol 10mg  BID Stable Long term BP goal normotensive   Hyperlipidemia Home meds:  Crestor  5mg  LDL 110, goal < 70 Now on Crestor  20mg   Continue statin at discharge  Other stroke risk factors Patient is a former smoker   Other Active Problems Anxiety/Depression Wellbutrin  home medication, continue     DISCHARGE EXAM Blood pressure (!) 141/86, pulse 65, temperature 98.1 F (36.7 C), temperature source Oral, resp. rate 15, height 6' (1.829 m), weight 89.5 kg, SpO2 94%.  PHYSICAL EXAM General:  Alert, well-nourished, well-developed patient in no acute distress Psych:  Calm and cooperative CV: Regular rate and rhythm on monitor Respiratory:  Regular, unlabored respirations on room air   NEURO:  Mental Status: AA&Ox3, patient is able to give clear and coherent history Speech/Language: speech is without dysarthria or aphasia.  Naming, repetition, fluency, and comprehension intact.   Cranial Nerves:  II: PERRL. Visual fields full.  III, IV, VI: EOMI. Eyelids elevate symmetrically.  V: Sensation is intact to light touch and symmetrical to face.  VII: Face is symmetrical resting and smiling VIII: hearing intact to voice. IX, X: Palate elevates symmetrically. Phonation is  normal.  [id number] shrug 5/5. XII: tongue is midline without fasciculations. Motor: 5/5 strength to all muscle groups tested. No drifts present.  Tone: is normal and bulk is normal Sensation- Intact to light touch bilaterally. Extinction absent to light touch to DSS.   Coordination: FTN intact bilaterally.  Gait- deferred   Discharge NIH: 0  Discharge Diet       Diet   Diet Heart Room service appropriate? Yes; Fluid consistency: Thin   liquids  DISCHARGE PLAN Disposition:  home aspirin  81 mg daily and clopidogrel  75 mg daily for secondary stroke prevention for 3 weeks then aspirin  alone. Ongoing stroke risk factor control by Primary Care Physician at time of discharge Follow-up PCP Joaquin Mulberry, MD in 2 weeks. Follow-up in [facility]  in 4-6 weeks, office to schedule an appointment.   35 minutes were spent preparing discharge.   Pt seen by Neuro NP/APP and later by MD. Note/plan to be edited by MD as needed.    Audrene Lease, DNP, AGACNP-BC [facility] Please use AMION for contact information & EPIC for messaging.  ATTENDING NOTE: I reviewed above note and agree with the assessment and plan. Pt was seen and examined.   No family at the bedside.  Patient reclining in bed, no complaints except still feeling of left leg not at 100% at baseline but have no difficulty walking to bathroom and in the hallway with PT.  MRI negative for acute infarct.  Etiology for patient's symptoms could be stroke aborted by TNK versus TIA versus anxiety.  Continue DAPT for 3 weeks and then aspirin  alone and continue statin.  PT and OT no recommendation.  Patient will be discharged in good condition.  Follow-up at [facility] in 4 to 6 weeks.  For detailed assessment and plan, please refer to above as I have made changes wherever appropriate.   Consuelo Denmark, MD PhD Stroke Neurology 01/10/2024 1:41 PM

## 2024-01-11 ENCOUNTER — Telehealth: Payer: Self-pay

## 2024-01-11 NOTE — Transitions of Care (Post Inpatient/ED Visit) (Signed)
   01/11/2024  Name: Dylan Burgess MRN: [id number] DOB: 01/21/1961  Today's TOC FU Call Status: Today's TOC FU Call Status:: Unsuccessful Call (1st Attempt) Unsuccessful Call (1st Attempt) Date: 01/11/24  Attempted to reach the patient regarding the most recent Inpatient/ED visit.  Follow Up Plan: Additional outreach attempts will be made to reach the patient to complete the Transitions of Care (Post Inpatient/ED visit) call.   Signature Burnett Carson, RN

## 2024-01-12 ENCOUNTER — Telehealth: Payer: Self-pay

## 2024-01-12 NOTE — Transitions of Care (Post Inpatient/ED Visit) (Signed)
   01/12/2024  Name: Dylan Burgess MRN: [id number] DOB: [birth year]  Today's TOC FU Call Status: Today's TOC FU Call Status:: Unsuccessful Call (2nd Attempt) Unsuccessful Call (1st Attempt) Date: 01/11/24 Unsuccessful Call (2nd Attempt) Date: 01/12/24  Attempted to reach the patient regarding the most recent Inpatient/ED visit.  Follow Up Plan: Additional outreach attempts will be made to reach the patient to complete the Transitions of Care (Post Inpatient/ED visit) call.   Signature  Burnett Carson, RN

## 2024-01-13 ENCOUNTER — Telehealth: Payer: Self-pay

## 2024-01-13 NOTE — Transitions of Care (Post Inpatient/ED Visit) (Signed)
   01/13/2024  Name: Quadri Perdomo MRN: [id number] DOB: 07-15-1961  Today's TOC FU Call Status: Today's TOC FU Call Status:: Unsuccessful Call (3rd Attempt) Unsuccessful Call (1st Attempt) Date: 01/11/24 Unsuccessful Call (2nd Attempt) Date: 01/12/24 Unsuccessful Call (3rd Attempt) Date: 01/13/24  Attempted to reach the patient regarding the most recent Inpatient/ED visit.  Follow Up Plan: No further outreach attempts will be made at this time. We have been unable to contact the patient.  Letter sent to patient requesting he contact [facility] to schedule an appointment as we have not been able to reach them.   Signature  Burnett Carson, RN

## 2024-01-20 ENCOUNTER — Other Ambulatory Visit: Payer: Self-pay

## 2024-01-20 MED ORDER — CHLORHEXIDINE GLUCONATE 0.12 % MT SOLN
Freq: Two times a day (BID) | OROMUCOSAL | 0 refills | Status: AC
  Filled 2024-01-20: qty 473, 16d supply, fill #0

## 2024-01-20 MED ORDER — AMOXICILLIN 500 MG PO CAPS
500.0000 mg | ORAL_CAPSULE | Freq: Three times a day (TID) | ORAL | 0 refills | Status: AC
  Filled 2024-01-20: qty 21, 7d supply, fill #0

## 2024-01-20 MED ORDER — IBUPROFEN 800 MG PO TABS
800.0000 mg | ORAL_TABLET | Freq: Three times a day (TID) | ORAL | 1 refills | Status: AC | PRN
  Filled 2024-01-20: qty 20, 10d supply, fill #0

## 2024-01-22 ENCOUNTER — Other Ambulatory Visit: Payer: Self-pay

## 2024-01-25 ENCOUNTER — Other Ambulatory Visit: Payer: Self-pay

## 2024-02-02 ENCOUNTER — Other Ambulatory Visit: Payer: Self-pay

## 2024-02-02 ENCOUNTER — Other Ambulatory Visit: Payer: Self-pay | Admitting: Family Medicine

## 2024-02-02 DIAGNOSIS — E785 Hyperlipidemia, unspecified: Secondary | ICD-10-CM

## 2024-02-02 MED ORDER — ROSUVASTATIN CALCIUM 20 MG PO TABS
20.0000 mg | ORAL_TABLET | Freq: Every day | ORAL | 0 refills | Status: DC
  Filled 2024-02-02 – 2024-02-25 (×2): qty 30, 30d supply, fill #0

## 2024-02-03 ENCOUNTER — Other Ambulatory Visit: Payer: Self-pay

## 2024-02-05 ENCOUNTER — Other Ambulatory Visit: Payer: Self-pay

## 2024-02-15 ENCOUNTER — Other Ambulatory Visit: Payer: Self-pay

## 2024-02-23 ENCOUNTER — Ambulatory Visit: Payer: Medicaid Other | Admitting: Family Medicine

## 2024-02-25 ENCOUNTER — Other Ambulatory Visit: Payer: Self-pay | Admitting: Family Medicine

## 2024-02-25 ENCOUNTER — Other Ambulatory Visit: Payer: Self-pay

## 2024-02-25 DIAGNOSIS — I10 Essential (primary) hypertension: Secondary | ICD-10-CM

## 2024-02-25 MED ORDER — LOSARTAN POTASSIUM 50 MG PO TABS
50.0000 mg | ORAL_TABLET | Freq: Every day | ORAL | 0 refills | Status: DC
  Filled 2024-02-25: qty 30, 30d supply, fill #0

## 2024-03-14 ENCOUNTER — Telehealth: Payer: Self-pay | Admitting: Family Medicine

## 2024-03-14 NOTE — Telephone Encounter (Unsigned)
 Copied from CRM [id number]. Topic: Clinical - Medication Refill >> Mar 14, 2024  1:35 PM Santiya F wrote: Medication: LORazepam  (ATIVAN ) 0.5 MG tablet [[id number]]  Has the patient contacted their pharmacy? Yes (Agent: If no, request that the patient contact the pharmacy for the refill. If patient does not wish to contact the pharmacy document the reason why and proceed with request.) (Agent: If yes, when and what did the pharmacy advise?)  This is the patient's preferred pharmacy:   [facility] - [facility] [address], [address] [place] [address]   Is this the correct pharmacy for this prescription? Yes   Has the prescription been filled recently? Yes  Is the patient out of the medication? Yes  Has the patient been seen for an appointment in the last year OR does the patient have an upcoming appointment? Yes  Can we respond through MyChart? Yes  Agent: Please be advised that Rx refills may take up to 3 business days. We ask that you follow-up with your pharmacy.

## 2024-03-15 NOTE — Telephone Encounter (Signed)
 Copied from CRM [id number]. Topic: Clinical - Medication Question >> Mar 15, 2024 11:48 AM Sophia H wrote:  Reason for CRM: Patient called in to check on medication refill for LORazepam  (ATIVAN ) 0.5 MG tablet, advised can take up to 3 business days, allow more time. Patient also states he is unsure if anything else is due for refill, please send in refills if so, did not provide medication names. States Whatever on my list that needs to be refilled  when asked to specify. Ty. # [phone]  [facility] - [facility]

## 2024-03-16 NOTE — Telephone Encounter (Signed)
 Requested medication (s) are due for refill today -unsure  Requested medication (s) are on the active medication list -yes  Future visit scheduled -yes  Last refill: 10/07/23 #14  Notes to clinic: non delegated Rx  Requested Prescriptions  Pending Prescriptions Disp Refills   LORazepam  (ATIVAN ) 0.5 MG tablet 14 tablet 0    Sig: Take 0.5-1 tablets (0.25-0.5 mg total) by mouth 2 (two) times daily as needed.     Not Delegated - Psychiatry: Anxiolytics/Hypnotics 2 Failed - 03/16/2024 11:35 AM      Failed - This refill cannot be delegated      Failed - Valid encounter within last 6 months    Recent Outpatient Visits           6 months ago Primary hypertension   [facility] Of [place]. [facility] Delbert Clam, MD   1 year ago Anxiety and depression   [facility] [place]. [facility] Delbert Clam, MD   1 year ago Encounter to establish care   [facility] Of [place]. [facility] Theotis, Haze ORN, NP       Future Appointments             In 3 weeks Delbert Clam, MD [facility] - A Dept Of [place]. [facility]            Passed - Urine Drug Screen completed in last 360 days      Passed - Patient is not pregnant         Requested Prescriptions  Pending Prescriptions Disp Refills   LORazepam  (ATIVAN ) 0.5 MG tablet 14 tablet 0    Sig: Take 0.5-1 tablets (0.25-0.5 mg total) by mouth 2 (two) times daily as needed.     Not Delegated - Psychiatry: Anxiolytics/Hypnotics 2 Failed - 03/16/2024 11:35 AM      Failed - This refill cannot be delegated      Failed - Valid encounter within last 6 months    Recent Outpatient Visits           6 months ago Primary hypertension   [facility] Of [place]. [facility] Delbert Clam, MD   1 year ago Anxiety and depression   [place] Of [place]. [facility] Delbert Clam, MD   1 year ago Encounter to establish care   [facility] - A Dept Of [place]. [facility] Theotis, Haze ORN, NP       Future Appointments             In 3 weeks Delbert Clam, MD [facility] [place] - A Dept Of [place]. [facility]            Passed - Urine Drug Screen completed in last 360 days      Passed - Patient is not pregnant

## 2024-03-17 NOTE — Telephone Encounter (Signed)
 Routing to PCP for review.

## 2024-03-21 NOTE — Telephone Encounter (Signed)
 LVM for patient to contact his physiatrist for refill.

## 2024-03-21 NOTE — Telephone Encounter (Signed)
 He needs to contact his psychiatrist who has been prescribing his lorazepam .

## 2024-03-24 ENCOUNTER — Other Ambulatory Visit: Payer: Self-pay

## 2024-03-24 ENCOUNTER — Emergency Department (HOSPITAL_COMMUNITY)
Admission: EM | Admit: 2024-03-24 | Discharge: 2024-03-24 | Disposition: A | Discharge status: Home / Self Care | Source: Home / Self Care | Attending: Emergency Medicine | Admitting: Emergency Medicine

## 2024-03-24 ENCOUNTER — Other Ambulatory Visit: Payer: Self-pay | Admitting: Family Medicine

## 2024-03-24 ENCOUNTER — Emergency Department (HOSPITAL_COMMUNITY)
Admission: EM | Admit: 2024-03-24 | Discharge: 2024-03-24 | Disposition: A | Discharge status: Home / Self Care | Attending: Emergency Medicine | Admitting: Emergency Medicine

## 2024-03-24 ENCOUNTER — Emergency Department (HOSPITAL_COMMUNITY)

## 2024-03-24 DIAGNOSIS — I10 Essential (primary) hypertension: Secondary | ICD-10-CM

## 2024-03-24 DIAGNOSIS — R3589 Other polyuria: Secondary | ICD-10-CM | POA: Diagnosis not present

## 2024-03-24 DIAGNOSIS — R109 Unspecified abdominal pain: Secondary | ICD-10-CM | POA: Insufficient documentation

## 2024-03-24 DIAGNOSIS — Z7982 Long term (current) use of aspirin: Secondary | ICD-10-CM | POA: Insufficient documentation

## 2024-03-24 DIAGNOSIS — Z79899 Other long term (current) drug therapy: Secondary | ICD-10-CM | POA: Diagnosis not present

## 2024-03-24 DIAGNOSIS — R1084 Generalized abdominal pain: Secondary | ICD-10-CM | POA: Diagnosis present

## 2024-03-24 DIAGNOSIS — D72829 Elevated white blood cell count, unspecified: Secondary | ICD-10-CM | POA: Insufficient documentation

## 2024-03-24 DIAGNOSIS — R519 Headache, unspecified: Secondary | ICD-10-CM | POA: Diagnosis present

## 2024-03-24 DIAGNOSIS — R350 Frequency of micturition: Secondary | ICD-10-CM | POA: Insufficient documentation

## 2024-03-24 DIAGNOSIS — J189 Pneumonia, unspecified organism: Secondary | ICD-10-CM | POA: Diagnosis not present

## 2024-03-24 DIAGNOSIS — Z7902 Long term (current) use of antithrombotics/antiplatelets: Secondary | ICD-10-CM | POA: Diagnosis not present

## 2024-03-24 LAB — CBC WITH DIFFERENTIAL/PLATELET
Abs Immature Granulocytes: 0.09 K/uL — ABNORMAL HIGH (ref 0.00–0.07)
Basophils Absolute: 0 K/uL (ref 0.0–0.1)
Basophils Relative: 0 %
Eosinophils Absolute: 0.1 K/uL (ref 0.0–0.5)
Eosinophils Relative: 0 %
HCT: 46.5 % (ref 39.0–52.0)
Hemoglobin: 15.1 g/dL (ref 13.0–17.0)
Immature Granulocytes: 0 %
Lymphocytes Relative: 7 %
Lymphs Abs: 1.5 K/uL (ref 0.7–4.0)
MCH: 27.2 pg (ref 26.0–34.0)
MCHC: 32.5 g/dL (ref 30.0–36.0)
MCV: 83.8 fL (ref 80.0–100.0)
Monocytes Absolute: 1.7 K/uL — ABNORMAL HIGH (ref 0.1–1.0)
Monocytes Relative: 8 %
Neutro Abs: 17.1 K/uL — ABNORMAL HIGH (ref 1.7–7.7)
Neutrophils Relative %: 85 %
Platelets: 280 K/uL (ref 150–400)
RBC: 5.55 MIL/uL (ref 4.22–5.81)
RDW: 13.3 % (ref 11.5–15.5)
WBC: 20.5 K/uL — ABNORMAL HIGH (ref 4.0–10.5)
nRBC: 0 % (ref 0.0–0.2)

## 2024-03-24 LAB — URINALYSIS, ROUTINE W REFLEX MICROSCOPIC
Bilirubin Urine: NEGATIVE
Glucose, UA: NEGATIVE mg/dL
Hgb urine dipstick: NEGATIVE
Ketones, ur: NEGATIVE mg/dL
Leukocytes,Ua: NEGATIVE
Nitrite: NEGATIVE
Protein, ur: NEGATIVE mg/dL
Specific Gravity, Urine: 1.002 — ABNORMAL LOW (ref 1.005–1.030)
pH: 7 (ref 5.0–8.0)

## 2024-03-24 LAB — BASIC METABOLIC PANEL WITH GFR
Anion gap: 9 (ref 5–15)
BUN: 12 mg/dL (ref 6–20)
CO2: 26 mmol/L (ref 22–32)
Calcium: 8.8 mg/dL — ABNORMAL LOW (ref 8.9–10.3)
Chloride: 101 mmol/L (ref 98–111)
Creatinine, Ser: 1.1 mg/dL (ref 0.61–1.24)
GFR, Estimated: >60 mL/min (ref >60)
Glucose, Bld: 121 mg/dL — ABNORMAL HIGH (ref 70–99)
Potassium: 3.7 mmol/L (ref 3.5–5.1)
Sodium: 136 mmol/L (ref 135–145)

## 2024-03-24 LAB — MAGNESIUM: Magnesium: 2 mg/dL (ref 1.7–2.4)

## 2024-03-24 MED ORDER — LACTATED RINGERS IV BOLUS
1000.0000 mL | Freq: Once | INTRAVENOUS | Status: AC
  Administered 2024-03-24: 1000 mL via INTRAVENOUS

## 2024-03-24 MED ORDER — NAPROXEN 500 MG PO TABS: 500.0000 mg | ORAL_TABLET | Freq: Two times a day (BID) | ORAL | 0 refills | Status: AC

## 2024-03-24 MED ORDER — IOHEXOL 350 MG/ML SOLN
75.0000 mL | Freq: Once | INTRAVENOUS | Status: AC | PRN
  Administered 2024-03-24: 75 mL via INTRAVENOUS

## 2024-03-24 MED ORDER — DOXYCYCLINE HYCLATE 100 MG PO CAPS: 100.0000 mg | ORAL_CAPSULE | Freq: Two times a day (BID) | ORAL | 0 refills | Status: DC

## 2024-03-24 MED ORDER — SODIUM CHLORIDE 0.9 % IV SOLN
1.0000 g | Freq: Once | INTRAVENOUS | Status: AC
Start: 2024-03-24 — End: 2024-03-24
  Administered 2024-03-24: 1 g via INTRAVENOUS
  Filled 2024-03-24: qty 10

## 2024-03-24 MED ORDER — KETOROLAC TROMETHAMINE 30 MG/ML IJ SOLN
30.0000 mg | Freq: Once | INTRAMUSCULAR | Status: AC
  Administered 2024-03-24: 30 mg via INTRAVENOUS
  Filled 2024-03-24: qty 1

## 2024-03-24 MED ORDER — KETOROLAC TROMETHAMINE 15 MG/ML IJ SOLN
15.0000 mg | Freq: Once | INTRAMUSCULAR | Status: AC
  Administered 2024-03-24: 15 mg via INTRAMUSCULAR
  Filled 2024-03-24: qty 1

## 2024-03-24 MED ORDER — METOCLOPRAMIDE HCL 10 MG PO TABS: 10.0000 mg | ORAL_TABLET | Freq: Four times a day (QID) | ORAL | 0 refills | Status: AC

## 2024-03-24 MED ORDER — METOCLOPRAMIDE HCL 10 MG PO TABS
10.0000 mg | ORAL_TABLET | Freq: Once | ORAL | Status: AC
  Administered 2024-03-24: 10 mg via ORAL
  Filled 2024-03-24: qty 1

## 2024-03-24 MED ORDER — DOXYCYCLINE HYCLATE 100 MG PO TABS
100.0000 mg | ORAL_TABLET | Freq: Once | ORAL | Status: AC
  Administered 2024-03-24: 100 mg via ORAL
  Filled 2024-03-24: qty 1

## 2024-03-24 NOTE — Discharge Instructions (Signed)
 Evidence of pneumonia on the CT scan in her lower lungs.  Antibiotic dose given in the emergency department.  Antibiotic prescribed as well.  Follow-up with your primary care doctor within a week for reevaluation.  Return for any concerning symptoms.  No concerning findings on the CT scan of your abdomen.

## 2024-03-24 NOTE — ED Provider Notes (Signed)
 [place] EMERGENCY DEPARTMENT AT [facility] Provider Note   CSN: [id number] Arrival date & time: 03/24/24  1414     Patient presents with: Abdominal Pain   Dylan Burgess is a 63 y.o. male.    Abdominal Pain Patient is a 63 year old male presenting ED today again for the second time after being discharged earlier this morning with complaints of generalized abdominal pain, urinary frequency.  Shown to have some possible pulmonary infiltrates with a leukocytosis and provided Rocephin , Toradol , sent out on doxycycline .  Did not meet SIRS criteria, no history of IVDA, no recent surgery, no nidus of infection, with low suspicion for ICH or meningitis.  Has not been able to pick up the medications at this time.  Reporting again this afternoon with the same symptoms.  Stating that he can not keep any fluids in citing that he has been having urinary frequency has been accompanied with some generalized abdominal pain, describing it as cramping. Endorses short episode of dizziness describing it as lightheadedness, and again stating that he has had difficulty peeing, with it feeling difficult to push out without any burning sensations. Reports last bowel movement was 3 days ago.  Denies fever, headache, blurry vision, vertigo, cough, congestion, chest pain, shortness of breath, nausea, vomiting, diarrhea, hematochezia, melena, hematuria, lower leg swelling, perirectal/perianal pain, penile discharge, testicular pain.     Prior to Admission medications   Medication Sig Start Date End Date Taking? Authorizing Provider  metoCLOPramide  (REGLAN ) 10 MG tablet Take 1 tablet (10 mg total) by mouth every 6 (six) hours. 03/24/24  Yes Beola Terrall RAMAN, PA-C  naproxen  (NAPROSYN ) 500 MG tablet Take 1 tablet (500 mg total) by mouth 2 (two) times daily. 03/24/24  Yes Beola Terrall RAMAN, PA-C  amLODipine  (NORVASC ) 5 MG tablet Take 1 tablet (5 mg total) by mouth daily. 12/10/23   Regalado,  Belkys A, MD  aspirin  EC 81 MG tablet Take 1 tablet (81 mg total) by mouth daily. Swallow whole. 01/11/24   Judithe Rocky BROCKS, NP  atomoxetine  (STRATTERA ) 40 MG capsule Take 1 capsule (40 mg total) by mouth daily. 08/26/23     buPROPion  (WELLBUTRIN  XL) 150 MG 24 hr tablet Take 1 tablet (150 mg total) by mouth every morning. 12/18/23     chlorhexidine  (PERIDEX ) 0.12 % solution Swish 1 capful  for 1 (one) minute then spit, 2 (two) times daily for 10 days. Do not swallow. 01/20/24     clopidogrel  (PLAVIX ) 75 MG tablet Take 1 tablet (75 mg total) by mouth daily. 01/11/24   Judithe Rocky BROCKS, NP  doxycycline  (VIBRAMYCIN ) 100 MG capsule Take 1 capsule (100 mg total) by mouth 2 (two) times daily. 03/24/24   Hildegard, Amjad, PA-C  famotidine  (PEPCID ) 40 MG tablet Take 1 tablet (40 mg total) by mouth daily. 12/10/23   Regalado, Belkys A, MD  ibuprofen  (ADVIL ) 800 MG tablet Take 1 tablet every 8 hours as needed for pain 01/20/24     LORazepam  (ATIVAN ) 0.5 MG tablet Take 0.5-1 tablets (0.25-0.5 mg total) by mouth 2 (two) times daily as needed. 10/07/23     losartan  (COZAAR ) 50 MG tablet Take 1 tablet (50 mg total) by mouth daily. 02/25/24   Newlin, Enobong, MD  rosuvastatin  (CRESTOR ) 20 MG tablet Take 1 tablet (20 mg total) by mouth daily. 02/02/24   Newlin, Enobong, MD  metoprolol  tartrate (LOPRESSOR ) 25 MG tablet Take 1 tablet (25 mg total) by mouth 2 (two) times daily. Patient not taking: Reported on 03/15/2019  04/21/18 01/20/20  Adella Norris, MD    Allergies: Patient has no known allergies.    Review of Systems  Gastrointestinal:  Positive for abdominal pain.    Updated Vital Signs BP 118/75 (BP Location: Right Arm)   Pulse 78   Temp 99.2 F (37.3 C) (Oral)   Resp 16   SpO2 97%   Physical Exam Vitals and nursing note reviewed.  Constitutional:      General: He is not in acute distress.    Appearance: Normal appearance. He is not ill-appearing or diaphoretic.  HENT:     Head: Normocephalic and atraumatic.      Mouth/Throat:     Mouth: Mucous membranes are moist.     Pharynx: Oropharynx is clear. No pharyngeal swelling or oropharyngeal exudate.  Eyes:     General: No scleral icterus.    Extraocular Movements: Extraocular movements intact.     Conjunctiva/sclera: Conjunctivae normal.     Pupils: Pupils are equal, round, and reactive to light.  Cardiovascular:     Rate and Rhythm: Normal rate and regular rhythm.     Pulses: Normal pulses.     Heart sounds: Normal heart sounds. No murmur heard.    No friction rub. No gallop.  Pulmonary:     Effort: Pulmonary effort is normal. No respiratory distress.     Breath sounds: Normal breath sounds. No stridor. No wheezing, rhonchi or rales.  Chest:     Chest wall: No tenderness.  Abdominal:     General: Abdomen is flat. There is no distension.     Palpations: Abdomen is soft. There is no pulsatile mass.     Tenderness: There is generalized abdominal tenderness. There is no right CVA tenderness, left CVA tenderness, guarding or rebound. Negative signs include Murphy's sign, Rovsing's sign, McBurney's sign, psoas sign and obturator sign.     Hernia: No hernia is present.  Skin:    General: Skin is warm and dry.     Coloration: Skin is not mottled or pale.     Findings: No erythema.  Neurological:     General: No focal deficit present.     Mental Status: He is alert and oriented to person, place, and time. Mental status is at baseline.     Cranial Nerves: No cranial nerve deficit.     Motor: No weakness.  Psychiatric:        Mood and Affect: Mood normal. Mood is not anxious.     (all labs ordered are listed, but only abnormal results are displayed) Labs Reviewed - No data to display  EKG: None  Radiology: CT ABDOMEN PELVIS W CONTRAST Result Date: 03/24/2024 CLINICAL DATA:  Abdominal pain.  Urinary frequency. EXAM: CT ABDOMEN AND PELVIS WITH CONTRAST TECHNIQUE: Multidetector CT imaging of the abdomen and pelvis was performed using the standard  protocol following bolus administration of intravenous contrast. RADIATION DOSE REDUCTION: This exam was performed according to the departmental dose-optimization program which includes automated exposure control, adjustment of the mA and/or kV according to patient size and/or use of iterative reconstruction technique. CONTRAST:  75mL OMNIPAQUE  IOHEXOL  350 MG/ML SOLN COMPARISON:  11/15/2022 FINDINGS: Lower chest: Dependent ground-glass and patchy consolidative airspace disease is noted in both lungs, progressive in the interval. 7 mm left lower lobe pulmonary nodule on 14/5 was 5 mm previously. Hepatobiliary: No suspicious focal abnormality within the liver parenchyma. There is no evidence for gallstones, gallbladder wall thickening, or pericholecystic fluid. No intrahepatic or extrahepatic biliary dilation. Pancreas: No focal  mass lesion. No dilatation of the main duct. No intraparenchymal cyst. No peripancreatic edema. Spleen: No splenomegaly. No suspicious focal mass lesion. Adrenals/Urinary Tract: No adrenal nodule or mass. Right kidney unremarkable. Tiny well-defined homogeneous low-density lesion in the left kidney is too small to characterize but is statistically most likely benign and probably a cyst. No followup imaging is recommended. No evidence for hydroureter. The urinary bladder appears normal for the degree of distention. Stomach/Bowel: Stomach is unremarkable. No gastric wall thickening. No evidence of outlet obstruction. Duodenum is normally positioned as is the ligament of Treitz. Proximal jejunum has an appearance suspicious for wall thickening but there is no perienteric edema or inflammation. Mid and distal small bowel unremarkable. The terminal ileum is normal. The appendix is normal. No gross colonic mass. No colonic wall thickening. Vascular/Lymphatic: There is mild atherosclerotic calcification of the abdominal aorta without aneurysm. There is no gastrohepatic or hepatoduodenal ligament  lymphadenopathy. No retroperitoneal or mesenteric lymphadenopathy. No pelvic sidewall lymphadenopathy. Reproductive: The prostate gland and seminal vesicles are unremarkable. Other: No intraperitoneal free fluid. Musculoskeletal: No worrisome lytic or sclerotic osseous abnormality. IMPRESSION: 1. Dependent ground-glass and patchy consolidative airspace disease in both lungs, progressive in the interval. Imaging features are suspicious for infectious/inflammatory etiology. 2. 7 mm left lower lobe pulmonary nodule was 5 mm previously. Given interval progression, close follow-up warranted. Repeat CT chest in 3 to 6 months recommended. 3. Proximal jejunum has an appearance suspicious for wall thickening but there is no perienteric edema or inflammation. Imaging features are nonspecific but enteritis not excluded. 4.  Aortic Atherosclerosis (ICD10-I70.0). Electronically Signed   By: Camellia Candle M.D.   On: 03/24/2024 09:56   DG Chest 2 View Result Date: 03/24/2024 CLINICAL DATA:  infection evaluation/PNA evaluation EXAM: CHEST - 2 VIEW COMPARISON:  Radiographs 09/25/2023 and 09/17/2022. FINDINGS: Interval improved inspiratory effort. The heart size and mediastinal contours are normal. The lungs are clear. There is no pleural effusion or pneumothorax. No acute osseous findings are identified. IMPRESSION: No evidence of active cardiopulmonary process. Electronically Signed   By: Elsie Perone M.D.   On: 03/24/2024 09:29    Procedures   Medications Ordered in the ED  ketorolac  (TORADOL ) 15 MG/ML injection 15 mg (15 mg Intramuscular Given 03/24/24 1550)  metoCLOPramide  (REGLAN ) tablet 10 mg (10 mg Oral Given 03/24/24 1550)  doxycycline  (VIBRA -TABS) tablet 100 mg (100 mg Oral Given 03/24/24 1551)                                  Medical Decision Making  This patient is a 63 year old male who presents to the ED for concern of generalized abdominal pain, urinary frequency that has been present since yesterday,  seen this morning in the ED and discharged on doxycycline  secondary to possible developing pneumonia with groundglass opacities noted bilaterally on CT.  Did note some potential wall thickening of the jejunum.  Leukocytosis of 20.5 was noted this morning however did not have any other signs of infection outside of the groundglass opacities noted on CT.  On physical exam, patient is in no acute distress, afebrile, alert and orient x 4, speaking in full sentences, nontachypneic, nontachycardic.  Noted to be resting upon initial presentation with no signs of obvious pain.  Oropharynx clear, no meningeal signs.  Notably is diffusely mildly tender across abdomen with no localization, no CVA tenderness, no testicular pain or penile pain.  No injuries or wound noted to  thorax, abdomen, back, upper extremities, lower extremities.  LCTAB, RRR, no murmur.  No lower leg edema.  Unremarkable exam otherwise.  Provided Reglan , doxycycline , Toradol  due to patient having some mild headache and generalized abdominal pain and not having picked up his doxycycline  yet.  On reevaluation, patient is noted to be feeling significantly better and is wanting home at this time.  Recommend that he continue to give his medication and will send home with anti-inflammatories as well as with the headache/nausea medication that was provided today.  Recommended continued follow-up with PCP for lab redraw as he did have a leukocytosis earlier this morning and ensure resolution of symptoms.  Patient vital signs have remained stable throughout the course of patient's time in the ED. Low suspicion for any other emergent pathology at this time. I believe this patient is safe to be discharged. Provided strict return to ER precautions. Patient expressed agreement and understanding of plan. All questions were answered.  Differential diagnoses prior to evaluation: The emergent differential diagnosis includes, but is not limited to, CAP, infectious  enteritis, BPH, prostatitis, SBO, diverticulitis, mesenteric ischemia, AAA. This is not an exhaustive differential.   Past Medical History / Co-morbidities / Social History: ADHD, depression, anxiety, HTN  Additional history: Chart reviewed. Pertinent results include:   Discharged from the ED this morning, concern for polyuria starting yesterday.  Noted to have a leukocytosis of 20,000.  No evidence of UTI on UA.  C chest x-ray revealed possibly developing consolidation in lower lungs.  Provided Rocephin  and started on doxycycline .   Admitted on 01/08/2024 for stroke.  Lab Tests/Imaging studies: Labs and imaging done this morning which showed a leukocytosis with a white count of 20.5, BMP shows preserved kidney function, normal electrolytes outside of a mild hypocalcemia of 8.8.  UA shows a decree specific gravity but otherwise unremarkable.  Magnesium  unremarkable.  CT scan done this morning did show a 7 mm left lower lobe pulmonary nodule that was previously 5 mm as well as groundglass opacities and patchy consolidative airspace disease in both lungs.  Suspicious for infectious etiology. Proximal jejunum did show some wall thickening without any perienteric edema or inflammation.   Medications: I ordered medication including Toradol , Reglan , doxycycline .  I have reviewed the patients home medicines and have made adjustments as needed.  Critical Interventions: None  Social Determinants of Health: Notably has good follow-up with PCP and states that he can schedule appointment soon for reevaluation  Disposition: After consideration of the diagnostic results and the patients response to treatment, I feel that the patient would benefit from discharge and treatment as above.   emergency department workup does not suggest an emergent condition requiring admission or immediate intervention beyond what has been performed at this time. The plan is: Follow-up with PCP for reevaluation, pursue  picking up prescriptions and taking doxycycline , return to the ED for new or worsening symptoms. The patient is safe for discharge and has been instructed to return immediately for worsening symptoms, change in symptoms or any other concerns.    Final diagnoses:  Generalized abdominal pain  Urinary frequency    ED Discharge Orders          Ordered    metoCLOPramide  (REGLAN ) 10 MG tablet  Every 6 hours        03/24/24 1642    naproxen  (NAPROSYN ) 500 MG tablet  2 times daily        03/24/24 1642  Beola Terrall RAMAN, PA-C 03/24/24 1645    Franklyn Sid SAILOR, MD 03/24/24 [phone]

## 2024-03-24 NOTE — ED Triage Notes (Signed)
 Pt BIB EMS after being discharged this morning. Now c/o increased weakness and abd cramping. Last BM 3 days ago, last meal yesterday. No NVD. Aox4.

## 2024-03-24 NOTE — ED Triage Notes (Signed)
 Pt. Stated, I took my BP medicine yesterday around 100pm to 2pm. Without eating. Since then especially during the night, Ive not stopped urinating all the time and I feel so bad. I just ache. I just can't stop urinating.

## 2024-03-24 NOTE — ED Provider Notes (Signed)
[place] [facility]GENC[facility] DEPARTMENT AT [facility] Provider Note   CSN: [id number] Arrival date & time: 03/24/24  9285     Patient presents with: Polyuria, Generalized Body Aches, and Headache   Dylan Burgess is a 63 y.o. male.   63 year old male presents today for concern of polyuria since yesterday.  He states he took his blood pressure medication around 1-2 PM but did not have this with food and he finally ate around 7 PM which is unusual for him.  He states if he does not take his medicines with food this typically happens but never this severe.  He denies any cough, shortness of breath, abdominal pain.  Does endorse some discomfort with urinating however he states this is because he is having to strain to get the urine out.  The history is provided by the patient. No language interpreter was used.       Prior to Admission medications   Medication Sig Start Date End Date Taking? Authorizing Provider  doxycycline  (VIBRAMYCIN ) 100 MG capsule Take 1 capsule (100 mg total) by mouth 2 (two) times daily. 03/24/24  Yes Shanica Castellanos, PA-C  amLODipine  (NORVASC ) 5 MG tablet Take 1 tablet (5 mg total) by mouth daily. 12/10/23   Regalado, Belkys A, MD  aspirin  EC 81 MG tablet Take 1 tablet (81 mg total) by mouth daily. Swallow whole. 01/11/24   Judithe Rocky BROCKS, NP  atomoxetine  (STRATTERA ) 40 MG capsule Take 1 capsule (40 mg total) by mouth daily. 08/26/23     buPROPion  (WELLBUTRIN  XL) 150 MG 24 hr tablet Take 1 tablet (150 mg total) by mouth every morning. 12/18/23     chlorhexidine  (PERIDEX ) 0.12 % solution Swish 1 capful  for 1 (one) minute then spit, 2 (two) times daily for 10 days. Do not swallow. 01/20/24     clopidogrel  (PLAVIX ) 75 MG tablet Take 1 tablet (75 mg total) by mouth daily. 01/11/24   Judithe Rocky BROCKS, NP  famotidine  (PEPCID ) 40 MG tablet Take 1 tablet (40 mg total) by mouth daily. 12/10/23   Regalado, Belkys A, MD  ibuprofen  (ADVIL ) 800 MG tablet Take 1 tablet every 8  hours as needed for pain 01/20/24     LORazepam  (ATIVAN ) 0.5 MG tablet Take 0.5-1 tablets (0.25-0.5 mg total) by mouth 2 (two) times daily as needed. 10/07/23     losartan  (COZAAR ) 50 MG tablet Take 1 tablet (50 mg total) by mouth daily. 02/25/24   Newlin, Enobong, MD  rosuvastatin  (CRESTOR ) 20 MG tablet Take 1 tablet (20 mg total) by mouth daily. 02/02/24   Newlin, Enobong, MD  metoprolol  tartrate (LOPRESSOR ) 25 MG tablet Take 1 tablet (25 mg total) by mouth 2 (two) times daily. Patient not taking: Reported on 03/15/2019 04/21/18 01/20/20  Adella Norris, MD    Allergies: Patient has no known allergies.    Review of Systems  Constitutional:  Negative for chills and fever.  Respiratory:  Negative for shortness of breath.   Gastrointestinal:  Negative for abdominal pain.  Genitourinary:  Positive for dysuria and frequency.  All other systems reviewed and are negative.   Updated Vital Signs BP (!) 137/90 (BP Location: Right Arm)   Pulse 78   Temp 98.5 F (36.9 C) (Oral)   Resp 18   Ht 6' (1.829 m)   Wt 97.5 kg   SpO2 99%   BMI 29.16 kg/m   Physical Exam Vitals and nursing note reviewed.  Constitutional:      General: He is not  in acute distress.    Appearance: Normal appearance. He is not ill-appearing.  HENT:     Head: Normocephalic and atraumatic.     Nose: Nose normal.  Eyes:     Conjunctiva/sclera: Conjunctivae normal.  Cardiovascular:     Rate and Rhythm: Normal rate and regular rhythm.  Pulmonary:     Effort: Pulmonary effort is normal. No respiratory distress.  Abdominal:     General: There is no distension.     Palpations: Abdomen is soft.     Tenderness: There is no abdominal tenderness. There is no guarding.  Musculoskeletal:        General: No deformity. Normal range of motion.  Skin:    Findings: No rash.  Neurological:     Mental Status: He is alert.     (all labs ordered are listed, but only abnormal results are displayed) Labs Reviewed  CBC WITH  DIFFERENTIAL/PLATELET - Abnormal; Notable for the following components:      Result Value   WBC 20.5 (*)    Neutro Abs 17.1 (*)    Monocytes Absolute 1.7 (*)    Abs Immature Granulocytes 0.09 (*)    All other components within normal limits  BASIC METABOLIC PANEL WITH GFR - Abnormal; Notable for the following components:   Glucose, Bld 121 (*)    Calcium  8.8 (*)    All other components within normal limits  URINALYSIS, ROUTINE W REFLEX MICROSCOPIC - Abnormal; Notable for the following components:   Color, Urine STRAW (*)    Specific Gravity, Urine 1.002 (*)    All other components within normal limits  MAGNESIUM     EKG: None  Radiology: CT ABDOMEN PELVIS W CONTRAST Result Date: 03/24/2024 CLINICAL DATA:  Abdominal pain.  Urinary frequency. EXAM: CT ABDOMEN AND PELVIS WITH CONTRAST TECHNIQUE: Multidetector CT imaging of the abdomen and pelvis was performed using the standard protocol following bolus administration of intravenous contrast. RADIATION DOSE REDUCTION: This exam was performed according to the departmental dose-optimization program which includes automated exposure control, adjustment of the mA and/or kV according to patient size and/or use of iterative reconstruction technique. CONTRAST:  75mL OMNIPAQUE  IOHEXOL  350 MG/ML SOLN COMPARISON:  11/15/2022 FINDINGS: Lower chest: Dependent ground-glass and patchy consolidative airspace disease is noted in both lungs, progressive in the interval. 7 mm left lower lobe pulmonary nodule on 14/5 was 5 mm previously. Hepatobiliary: No suspicious focal abnormality within the liver parenchyma. There is no evidence for gallstones, gallbladder wall thickening, or pericholecystic fluid. No intrahepatic or extrahepatic biliary dilation. Pancreas: No focal mass lesion. No dilatation of the main duct. No intraparenchymal cyst. No peripancreatic edema. Spleen: No splenomegaly. No suspicious focal mass lesion. Adrenals/Urinary Tract: No adrenal nodule or  mass. Right kidney unremarkable. Tiny well-defined homogeneous low-density lesion in the left kidney is too small to characterize but is statistically most likely benign and probably a cyst. No followup imaging is recommended. No evidence for hydroureter. The urinary bladder appears normal for the degree of distention. Stomach/Bowel: Stomach is unremarkable. No gastric wall thickening. No evidence of outlet obstruction. Duodenum is normally positioned as is the ligament of Treitz. Proximal jejunum has an appearance suspicious for wall thickening but there is no perienteric edema or inflammation. Mid and distal small bowel unremarkable. The terminal ileum is normal. The appendix is normal. No gross colonic mass. No colonic wall thickening. Vascular/Lymphatic: There is mild atherosclerotic calcification of the abdominal aorta without aneurysm. There is no gastrohepatic or hepatoduodenal ligament lymphadenopathy. No retroperitoneal or mesenteric  lymphadenopathy. No pelvic sidewall lymphadenopathy. Reproductive: The prostate gland and seminal vesicles are unremarkable. Other: No intraperitoneal free fluid. Musculoskeletal: No worrisome lytic or sclerotic osseous abnormality. IMPRESSION: 1. Dependent ground-glass and patchy consolidative airspace disease in both lungs, progressive in the interval. Imaging features are suspicious for infectious/inflammatory etiology. 2. 7 mm left lower lobe pulmonary nodule was 5 mm previously. Given interval progression, close follow-up warranted. Repeat CT chest in 3 to 6 months recommended. 3. Proximal jejunum has an appearance suspicious for wall thickening but there is no perienteric edema or inflammation. Imaging features are nonspecific but enteritis not excluded. 4.  Aortic Atherosclerosis (ICD10-I70.0). Electronically Signed   By: Camellia Candle M.D.   On: 03/24/2024 09:56   DG Chest 2 View Result Date: 03/24/2024 CLINICAL DATA:  infection evaluation/PNA evaluation EXAM: CHEST  - 2 VIEW COMPARISON:  Radiographs 09/25/2023 and 09/17/2022. FINDINGS: Interval improved inspiratory effort. The heart size and mediastinal contours are normal. The lungs are clear. There is no pleural effusion or pneumothorax. No acute osseous findings are identified. IMPRESSION: No evidence of active cardiopulmonary process. Electronically Signed   By: Elsie Perone M.D.   On: 03/24/2024 09:29     Procedures   Medications Ordered in the ED  lactated ringers  bolus 1,000 mL (0 mLs Intravenous Stopped 03/24/24 1029)  ketorolac  (TORADOL ) 30 MG/ML injection 30 mg (30 mg Intravenous Given 03/24/24 1002)  iohexol  (OMNIPAQUE ) 350 MG/ML injection 75 mL (75 mLs Intravenous Contrast Given 03/24/24 0944)  cefTRIAXone  (ROCEPHIN ) 1 g in sodium chloride  0.9 % 100 mL IVPB (0 g Intravenous Stopped 03/24/24 1110)    Clinical Course as of 03/24/24 1132  Thu Mar 24, 2024  0901 Pt is 63 yo presenting to the ED with 1 day of body aches, mild headache, urinary frequency since last night.  Had some generalized abdominal discomfort this morning, no vomiting or diarrhea.  On exam afebrile, normal vitals, no significant abdominal tenderness or rebound, mild lumbar tenderness.  No photophobia or nuccal rigidity or confusion.  Labs notable for WBC elevation at 20.5, BMP unremarkable, UA without sign of infection.  No ketones, glucose in urine or anion gap to suggest new onset diabetes [MT]  0903 WBC elevation is concerning to me - although he does not meet SIRS criteria, or have hx of IVDA, recent surgery, or clear nidus of infection.  Will proceed with CT abdomen to evaluate for intraabdominal pathology given abdominal discomfort, and consider xray chest as well for PNA evaluation.  Very low suspicion clinically for meningitis or ICH at this time. [MT]  1029 Plan to treat for possible pulmonary infection/PNA with course of antibiotics [MT]    Clinical Course User Index [MT] Trifan, Donnice PARAS, MD                                  Medical Decision Making Amount and/or Complexity of Data Reviewed Labs: ordered. Radiology: ordered.  Risk Prescription drug management.   Medical Decision Making / ED Course   This patient presents to the ED for concern of polyuria, this involves an extensive number of treatment options, and is a complaint that carries with it a high risk of complications and morbidity.  The differential diagnosis includes UTI, BPH, other infectious process, pyelonephritis  MDM: 63 year old male presents today for above-mentioned complaints.  Overall he is well-appearing. Labs ordered.  CBC with significant leukocytosis of 20,000 with left shift.  No anemia.  UA  without evidence of UTI.  BMP with preserved renal function, normal electrolytes, normal magnesium . CT abdomen ordered to evaluate for acute infection given the leukocytosis.  Chest x-ray ordered.  Chest x-ray without acute cardiopulmonary process.  CT shows no acute intra-abdominal process but shows potential consolidation in the lower lungs.  Will give Rocephin  and start on doxycycline . Seen by attending as well. No evidence of sepsis.  Not SIRS positive. Discharged in stable condition.  Return precautions discussed.  Patient voices understanding and is in agreement with plan.  Lab Tests: -I ordered, reviewed, and interpreted labs.   The pertinent results include:   Labs Reviewed  CBC WITH DIFFERENTIAL/PLATELET - Abnormal; Notable for the following components:      Result Value   WBC 20.5 (*)    Neutro Abs 17.1 (*)    Monocytes Absolute 1.7 (*)    Abs Immature Granulocytes 0.09 (*)    All other components within normal limits  BASIC METABOLIC PANEL WITH GFR - Abnormal; Notable for the following components:   Glucose, Bld 121 (*)    Calcium  8.8 (*)    All other components within normal limits  URINALYSIS, ROUTINE W REFLEX MICROSCOPIC - Abnormal; Notable for the following components:   Color, Urine STRAW (*)    Specific Gravity,  Urine 1.002 (*)    All other components within normal limits  MAGNESIUM       EKG  EKG Interpretation Date/Time:    Ventricular Rate:    PR Interval:    QRS Duration:    QT Interval:    QTC Calculation:   R Axis:      Text Interpretation:           Imaging Studies ordered: I ordered imaging studies including CT abdomen pelvis with contrast, chest x-ray I independently visualized and interpreted imaging. I agree with the radiologist interpretation   Medicines ordered and prescription drug management: Meds ordered this encounter  Medications   lactated ringers  bolus 1,000 mL   ketorolac  (TORADOL ) 30 MG/ML injection 30 mg   iohexol  (OMNIPAQUE ) 350 MG/ML injection 75 mL   cefTRIAXone  (ROCEPHIN ) 1 g in sodium chloride  0.9 % 100 mL IVPB    Antibiotic Indication::   CAP   doxycycline  (VIBRAMYCIN ) 100 MG capsule    Sig: Take 1 capsule (100 mg total) by mouth 2 (two) times daily.    Dispense:  20 capsule    Refill:  0    Supervising Provider:   CLEOTILDE, BRIAN [[id number]]    -I have reviewed the patients home medicines and have made adjustments as needed     Reevaluation: After the interventions noted above, I reevaluated the patient and found that they have :improved  Co morbidities that complicate the patient evaluation  Past Medical History:  Diagnosis Date   ADHD (attention deficit hyperactivity disorder)    Anxiety    Depression    Hypertension       Dispostion: Discharged in stable condition.  Return precaution discussed.  Patient voices understanding and is in agreement with plan.  @PCDICTATION @      Final diagnoses:  Community acquired pneumonia, unspecified laterality    ED Discharge Orders          Ordered    doxycycline  (VIBRAMYCIN ) 100 MG capsule  2 times daily        03/24/24 1132               Hildegard Loge, PA-C 03/24/24 1156    Cottie Donnice PARAS, MD 03/24/24  1235  

## 2024-03-24 NOTE — Discharge Instructions (Addendum)
 You are seen today for urinary frequency and was abdominal pain, having been previously sent home on doxycycline  with suspicion for pneumonia.  Provided you anti-inflammatory medication as well as some nausea medication.  I will send these home for you to use and pick up at the pharmacy along with your doxycycline  which is already sent in.  Please continue take antibiotics for the full course and follow-up with PCP as you will need to be reevaluated to ensure that your symptoms and lab levels have improved.  Return to the ED though if you do not have any new or worsening symptoms which would include uncontrolled pain, blood in urine or stool or sputum, shortness of breath, chest pain, headache with vision changes and fever

## 2024-03-25 ENCOUNTER — Encounter: Payer: Self-pay | Admitting: Pharmacist

## 2024-03-25 ENCOUNTER — Other Ambulatory Visit: Payer: Self-pay

## 2024-04-05 ENCOUNTER — Telehealth: Payer: Self-pay | Admitting: Family Medicine

## 2024-04-05 NOTE — Telephone Encounter (Signed)
 Pt confirmed appt

## 2024-04-06 ENCOUNTER — Other Ambulatory Visit: Payer: Self-pay

## 2024-04-06 ENCOUNTER — Ambulatory Visit: Attending: Family Medicine | Admitting: Family Medicine

## 2024-04-06 ENCOUNTER — Ambulatory Visit: Admitting: Family Medicine

## 2024-04-06 ENCOUNTER — Encounter: Payer: Self-pay | Admitting: Family Medicine

## 2024-04-06 VITALS — BP 149/92 | HR 59 | Ht 72.0 in | Wt 200.0 lb

## 2024-04-06 DIAGNOSIS — E1169 Type 2 diabetes mellitus with other specified complication: Secondary | ICD-10-CM

## 2024-04-06 DIAGNOSIS — E785 Hyperlipidemia, unspecified: Secondary | ICD-10-CM

## 2024-04-06 DIAGNOSIS — Z7984 Long term (current) use of oral hypoglycemic drugs: Secondary | ICD-10-CM | POA: Diagnosis not present

## 2024-04-06 DIAGNOSIS — Z8673 Personal history of transient ischemic attack (TIA), and cerebral infarction without residual deficits: Secondary | ICD-10-CM

## 2024-04-06 DIAGNOSIS — I152 Hypertension secondary to endocrine disorders: Secondary | ICD-10-CM

## 2024-04-06 DIAGNOSIS — I10 Essential (primary) hypertension: Secondary | ICD-10-CM | POA: Diagnosis not present

## 2024-04-06 LAB — POCT GLYCOSYLATED HEMOGLOBIN (HGB A1C): HbA1c, POC (controlled diabetic range): 6.7 % (ref 0.0–7.0)

## 2024-04-06 MED ORDER — METFORMIN HCL ER 500 MG PO TB24
500.0000 mg | ORAL_TABLET | Freq: Every day | ORAL | 1 refills | Status: DC
  Filled 2024-04-06: qty 90, 90d supply, fill #0

## 2024-04-06 MED ORDER — ROSUVASTATIN CALCIUM 40 MG PO TABS
40.0000 mg | ORAL_TABLET | Freq: Every day | ORAL | 1 refills | Status: AC
  Filled 2024-04-06: qty 90, 90d supply, fill #0
  Filled 2024-07-13: qty 90, 90d supply, fill #1

## 2024-04-06 MED ORDER — AMLODIPINE BESYLATE 5 MG PO TABS
5.0000 mg | ORAL_TABLET | Freq: Every day | ORAL | 1 refills | Status: AC
  Filled 2024-04-06: qty 90, 90d supply, fill #0
  Filled 2024-07-13: qty 90, 90d supply, fill #1

## 2024-04-06 MED ORDER — LOSARTAN POTASSIUM 100 MG PO TABS
100.0000 mg | ORAL_TABLET | Freq: Every day | ORAL | 1 refills | Status: AC
  Filled 2024-04-06: qty 90, 90d supply, fill #0
  Filled 2024-07-13: qty 90, 90d supply, fill #1

## 2024-04-06 NOTE — Progress Notes (Signed)
 Subjective:  Patient ID: Dylan Burgess, male    DOB: 14-Jun-1961  Age: 63 y.o. MRN: [id number]  CC: Medical Management of Chronic Issues (Discuss crestor  dosage/)     Discussed the use of AI scribe software for clinical note transcription with the patient, who gave verbal consent to proceed.  History of Present Illness Dylan Burgess is a 63 year old male with a history of Anxiety and Depression, HTN, GERD, acute stroke in 12/2023 secondary to left caudate pronto and lacunar infarct (status post TNK) who presents for follow-up after hospitalization for pneumonia.  He was recently seen at the ED 2 weeks ago for pneumonia with bilateral lung involvement confirmed by CT scan and treated with IV antibiotics. He completed a course of doxycycline  and reports improvement, with no shortness of breath or chest pain. He occasionally feels as though symptoms might return. He experienced stroke-like symptoms in April, treated with TNK, with resolution of symptoms and no residual weakness.  He completed with 3-week course of dual antiplatelet therapy with aspirin  and Plavix  and has since discontinued the Plavix  but is now on aspirin .  He has not followed up with a neurologist.  He is on amlodipine  and losartan  for hypertension, with a recent blood pressure of 145/81 mmHg. He experiences frequent urination, which he attributes to his medication, and occasional cramping when taking aspirin  with his blood pressure medication.   He takes Crestor  for high cholesterol, with an increased dosage to 40 mg. A recent A1c of 6.5 was noted during his hospital stay and he states he is unaware of this but repeat A1c returned with a new diagnosis of type 2 diabetes and an A1c of 6.7.  He endorses a positive family history of type 2 diabetes mellitus  He is under psychiatric care with a change in psychiatrist and a new appointment scheduled. He is prescribed Strattera , lorazepam , and Wellbutrin , with  issues obtaining lorazepam  from the pharmacy.    Past Medical History:  Diagnosis Date   ADHD (attention deficit hyperactivity disorder)    Anxiety    Depression    Hypertension     No past surgical history on file.  Family History  Problem Relation Age of Onset   Kidney disease Mother        was on dialysis at time of death   Diabetes Mother    Hypertension Mother    Hypertension Sister    Kidney disease Brother        kidney failure on dialysis   Hypertension Brother        Cause of kidney failure   Diabetes Brother    Hyperlipidemia Brother    Hyperlipidemia Brother    Obesity Daughter    Colon cancer Neg Hx    Rectal cancer Neg Hx    Stomach cancer Neg Hx    Esophageal cancer Neg Hx    Pancreatic cancer Neg Hx    Liver cancer Neg Hx     Social History   Socioeconomic History   Marital status: Legally Separated    Spouse name: Not on file   Number of children: 5   Years of education: 13   Highest education level: Some college, no degree  Occupational History   Occupation: truck driver--short distance  Tobacco Use   Smoking status: Former    Current packs/day: 0.00    Types: Cigarettes    Start date: 09/21/1997    Quit date: 09/21/2002    Years since quitting: 21.5  Smokeless tobacco: Never  Vaping Use   Vaping status: Never Used  Substance and Sexual Activity   Alcohol use: Not Currently   Drug use: No   Sexual activity: Not on file  Other Topics Concern   Not on file  Social History Narrative   Originally from [place] .   Difficult to get history   Has lived in [place] since 2007.   Lived in [place] since 1990   Children live in [place] or farther away   He currently lives with his girlfriend and 3 dogs.   Social Drivers of Health   Financial Resource Strain: High Risk (04/02/2024)   Overall Financial Resource Strain (CARDIA)    Difficulty of Paying Living Expenses: Hard  Food Insecurity: Food Insecurity Present (04/02/2024)   Hunger  Vital Sign    Worried About Running Out of Food in the Last Year: Sometimes true    Ran Out of Food in the Last Year: Sometimes true  Transportation Needs: Unmet Transportation Needs (04/02/2024)   PRAPARE - Transportation    Lack of Transportation (Medical): No    Lack of Transportation (Non-Medical): Yes  Physical Activity: Sufficiently Active (04/02/2024)   Exercise Vital Sign    Days of Exercise per Week: 7 days    Minutes of Exercise per Session: 30 min  Stress: Stress Concern Present (04/02/2024)   [employer] of Occupational Health - Occupational Stress Questionnaire    Feeling of Stress: Very much  Social Connections: Socially Isolated (04/02/2024)   Social Connection and Isolation Panel    Frequency of Communication with Friends and Family: Never    Frequency of Social Gatherings with Friends and Family: Never    Attends Religious Services: Never    Database administrator or Organizations: No    Attends Engineer, structural: Not on file    Marital Status: Separated    No Known Allergies  Outpatient Medications Prior to Visit  Medication Sig Dispense Refill   aspirin  EC 81 MG tablet Take 1 tablet (81 mg total) by mouth daily. Swallow whole. 30 tablet 12   atomoxetine  (STRATTERA ) 40 MG capsule Take 1 capsule (40 mg total) by mouth daily. 90 capsule 0   buPROPion  (WELLBUTRIN  XL) 150 MG 24 hr tablet Take 1 tablet (150 mg total) by mouth every morning. 90 tablet 0   chlorhexidine  (PERIDEX ) 0.12 % solution Swish 1 capful  for 1 (one) minute then spit, 2 (two) times daily for 10 days. Do not swallow. 473 mL 0   doxycycline  (VIBRAMYCIN ) 100 MG capsule Take 1 capsule (100 mg total) by mouth 2 (two) times daily. 20 capsule 0   famotidine  (PEPCID ) 40 MG tablet Take 1 tablet (40 mg total) by mouth daily. 30 tablet 1   ibuprofen  (ADVIL ) 800 MG tablet Take 1 tablet every 8 hours as needed for pain 20 tablet 1   LORazepam  (ATIVAN ) 0.5 MG tablet Take 0.5-1 tablets (0.25-0.5 mg  total) by mouth 2 (two) times daily as needed. 14 tablet 0   metoCLOPramide  (REGLAN ) 10 MG tablet Take 1 tablet (10 mg total) by mouth every 6 (six) hours. 15 tablet 0   naproxen  (NAPROSYN ) 500 MG tablet Take 1 tablet (500 mg total) by mouth 2 (two) times daily. 30 tablet 0   amLODipine  (NORVASC ) 5 MG tablet Take 1 tablet (5 mg total) by mouth daily. 90 tablet 1   clopidogrel  (PLAVIX ) 75 MG tablet Take 1 tablet (75 mg total) by mouth daily. 21 tablet 0   losartan  (  COZAAR ) 50 MG tablet Take 1 tablet (50 mg total) by mouth daily. 30 tablet 0   rosuvastatin  (CRESTOR ) 20 MG tablet Take 1 tablet (20 mg total) by mouth daily. 30 tablet 0   No facility-administered medications prior to visit.     ROS Review of Systems  Constitutional:  Negative for activity change and appetite change.  HENT:  Negative for sinus pressure and sore throat.   Respiratory:  Negative for chest tightness, shortness of breath and wheezing.   Cardiovascular:  Negative for chest pain and palpitations.  Gastrointestinal:  Negative for abdominal distention, abdominal pain and constipation.  Genitourinary: Negative.   Musculoskeletal: Negative.   Psychiatric/Behavioral:  Negative for behavioral problems and dysphoric mood.     Objective:  BP (!) 149/92   Pulse (!) 59   Ht 6' (1.829 m)   Wt 200 lb (90.7 kg)   SpO2 99%   BMI 27.12 kg/m      04/06/2024   11:29 AM 04/06/2024   11:09 AM 03/24/2024    2:22 PM  BP/Weight  Systolic BP 149 143 118  Diastolic BP 92 81 75  Wt. (Lbs)  200   BMI  27.12 kg/m2       Physical Exam Constitutional:      Appearance: He is well-developed.  Cardiovascular:     Rate and Rhythm: Normal rate.     Heart sounds: Normal heart sounds. No murmur heard. Pulmonary:     Effort: Pulmonary effort is normal.     Breath sounds: Normal breath sounds. No wheezing or rales.  Chest:     Chest wall: No tenderness.  Abdominal:     General: Bowel sounds are normal. There is no distension.      Palpations: Abdomen is soft. There is no mass.     Tenderness: There is no abdominal tenderness.  Musculoskeletal:        General: Normal range of motion.     Right lower leg: No edema.     Left lower leg: No edema.  Neurological:     Mental Status: He is alert and oriented to person, place, and time.  Psychiatric:        Mood and Affect: Mood normal.        Latest Ref Rng & Units 03/24/2024    7:41 AM 01/09/2024    4:07 AM 01/08/2024    1:09 PM  CMP  Glucose 70 - 99 mg/dL 878  891  849   BUN 6 - 20 mg/dL 12  8  12    Creatinine 0.61 - 1.24 mg/dL 8.89  9.01  8.89   Sodium 135 - 145 mmol/L 136  138  139   Potassium 3.5 - 5.1 mmol/L 3.7  3.3  3.9   Chloride 98 - 111 mmol/L 101  103  103   CO2 22 - 32 mmol/L 26  25    Calcium  8.9 - 10.3 mg/dL 8.8  8.5    Total Protein 6.5 - 8.1 g/dL  6.8    Total Bilirubin 0.0 - 1.2 mg/dL  0.8    Alkaline Phos 38 - 126 U/L  64    AST 15 - 41 U/L  16    ALT 0 - 44 U/L  14      Lipid Panel     Component Value Date/Time   CHOL 165 01/09/2024 0407   CHOL 204 (H) 12/24/2022 1023   TRIG 68 01/09/2024 0407   HDL 41 01/09/2024 0407   HDL  44 12/24/2022 1023   CHOLHDL 4.0 01/09/2024 0407   VLDL 14 01/09/2024 0407   LDLCALC 110 (H) 01/09/2024 0407   LDLCALC 144 (H) 12/24/2022 1023   LDLDIRECT 112 (H) 01/11/2009 2207    CBC    Component Value Date/Time   WBC 20.5 (H) 03/24/2024 0741   RBC 5.55 03/24/2024 0741   HGB 15.1 03/24/2024 0741   HGB 15.3 04/21/2018 1150   HCT 46.5 03/24/2024 0741   HCT 45.6 04/21/2018 1150   PLT 280 03/24/2024 0741   PLT 297 04/21/2018 1150   MCV 83.8 03/24/2024 0741   MCV 85 04/21/2018 1150   MCH 27.2 03/24/2024 0741   MCHC 32.5 03/24/2024 0741   RDW 13.3 03/24/2024 0741   RDW 14.5 04/21/2018 1150   LYMPHSABS 1.5 03/24/2024 0741   LYMPHSABS 2.1 04/21/2018 1150   MONOABS 1.7 (H) 03/24/2024 0741   EOSABS 0.1 03/24/2024 0741   EOSABS 0.2 04/21/2018 1150   BASOSABS 0.0 03/24/2024 0741   BASOSABS 0.0  04/21/2018 1150    Lab Results  Component Value Date   HGBA1C 6.7 04/06/2024      1. Hypertension associated with type 2 diabetes mellitus (HCC) Uncontrolled Losartan  dose increased from 50 mg to 100 mg Will check potassium at next visit Counseled on blood pressure goal of less than 130/80, low-sodium, DASH diet, medication compliance, 150 minutes of moderate intensity exercise per week. Discussed medication compliance, adverse effects. - amLODipine  (NORVASC ) 5 MG tablet; Take 1 tablet (5 mg total) by mouth daily.  Dispense: 90 tablet; Refill: 1 - losartan  (COZAAR ) 100 MG tablet; Take 1 tablet (100 mg total) by mouth daily.  Dispense: 90 tablet; Refill: 1  2. Type 2 diabetes mellitus with other specified complication, without long-term current use of insulin (HCC) New diagnosis with A1c of 6.7 Initiate metformin  - POCT glycosylated hemoglobin (Hb A1C) - metFORMIN  (GLUCOPHAGE -XR) 500 MG 24 hr tablet; Take 1 tablet (500 mg total) by mouth daily with breakfast.  Dispense: 90 tablet; Refill: 1  3. History of stroke No residual deficits Aggressive secondary risk factor modification including blood pressure, lipid and blood glucose control Continue high intensity statin - Ambulatory referral to Neurology  4. Hyperlipidemia associated with type 2 diabetes mellitus (HCC) (Primary) Uncontrolled Crestor  dose was increased from 20 mg to 40 mg during hospitalization Low-cholesterol diet - rosuvastatin  (CRESTOR ) 40 MG tablet; Take 1 tablet (40 mg total) by mouth daily.  Dispense: 90 tablet; Refill: 1   Meds ordered this encounter  Medications   amLODipine  (NORVASC ) 5 MG tablet    Sig: Take 1 tablet (5 mg total) by mouth daily.    Dispense:  90 tablet    Refill:  1   losartan  (COZAAR ) 100 MG tablet    Sig: Take 1 tablet (100 mg total) by mouth daily.    Dispense:  90 tablet    Refill:  1    Dose increase   rosuvastatin  (CRESTOR ) 40 MG tablet    Sig: Take 1 tablet (40 mg total) by  mouth daily.    Dispense:  90 tablet    Refill:  1   metFORMIN  (GLUCOPHAGE -XR) 500 MG 24 hr tablet    Sig: Take 1 tablet (500 mg total) by mouth daily with breakfast.    Dispense:  90 tablet    Refill:  1    Follow-up: Return in about 3 months (around 07/07/2024) for Chronic medical conditions.       Corrina Sabin, MD, FAAFP. [facility] [place], [place] [phone]   04/06/2024, 12:18 PM

## 2024-04-06 NOTE — Patient Instructions (Signed)

## 2024-04-20 ENCOUNTER — Telehealth: Payer: Self-pay

## 2024-04-20 NOTE — Patient Outreach (Signed)
 First telephone outreach attempt to obtain mRS. No answer. Left message for returned call.  [facility]  Population Health Care Management Assistant  Direct Dial: [phone]  Fax: [phone] Website: [email or web address]

## 2024-04-20 NOTE — Patient Outreach (Signed)
 Telephone outreach to patient to obtain mRS was successfully completed. MRS= 1  [facility] Health Care Management Assistant  Direct Dial: [phone]  Fax: [phone] Website: [email or web address]

## 2024-04-26 ENCOUNTER — Other Ambulatory Visit: Payer: Self-pay

## 2024-04-26 MED ORDER — GUANFACINE HCL ER 1 MG PO TB24
1.0000 mg | ORAL_TABLET | Freq: Every day | ORAL | 0 refills | Status: AC
  Filled 2024-04-26: qty 90, 90d supply, fill #0

## 2024-04-26 MED ORDER — BUPROPION HCL ER (XL) 150 MG PO TB24
150.0000 mg | ORAL_TABLET | Freq: Every morning | ORAL | 0 refills | Status: DC
  Filled 2024-06-06: qty 90, 90d supply, fill #0

## 2024-04-27 ENCOUNTER — Other Ambulatory Visit: Payer: Self-pay

## 2024-04-29 ENCOUNTER — Other Ambulatory Visit: Payer: Self-pay

## 2024-04-29 MED ORDER — LORAZEPAM 0.5 MG PO TABS
0.2500 mg | ORAL_TABLET | Freq: Every day | ORAL | 1 refills | Status: AC | PRN
  Filled 2024-04-29: qty 30, 30d supply, fill #0

## 2024-04-29 MED ORDER — PROPRANOLOL HCL 10 MG PO TABS
10.0000 mg | ORAL_TABLET | Freq: Two times a day (BID) | ORAL | 0 refills | Status: DC
  Filled 2024-04-29: qty 180, 90d supply, fill #0

## 2024-05-02 ENCOUNTER — Other Ambulatory Visit: Payer: Self-pay

## 2024-05-20 ENCOUNTER — Encounter (HOSPITAL_COMMUNITY): Payer: Self-pay

## 2024-05-20 ENCOUNTER — Other Ambulatory Visit: Payer: Self-pay

## 2024-05-20 ENCOUNTER — Emergency Department (HOSPITAL_COMMUNITY)
Admission: EM | Admit: 2024-05-20 | Discharge: 2024-05-20 | Discharge status: Against Medical Advice | Attending: Emergency Medicine | Admitting: Emergency Medicine

## 2024-05-20 DIAGNOSIS — U071 COVID-19: Secondary | ICD-10-CM | POA: Insufficient documentation

## 2024-05-20 DIAGNOSIS — Z5321 Procedure and treatment not carried out due to patient leaving prior to being seen by health care provider: Secondary | ICD-10-CM | POA: Insufficient documentation

## 2024-05-20 DIAGNOSIS — M791 Myalgia, unspecified site: Secondary | ICD-10-CM | POA: Diagnosis present

## 2024-05-20 LAB — RESP PANEL BY RT-PCR (RSV, FLU A&B, COVID)  RVPGX2
Influenza A by PCR: NEGATIVE
Influenza B by PCR: NEGATIVE
Resp Syncytial Virus by PCR: NEGATIVE
SARS Coronavirus 2 by RT PCR: POSITIVE — AB

## 2024-05-20 NOTE — ED Triage Notes (Signed)
 C/o bodyaches onset 2-3 days ago no history of fever, states he did have some chills.

## 2024-06-07 ENCOUNTER — Other Ambulatory Visit: Payer: Self-pay

## 2024-06-30 ENCOUNTER — Other Ambulatory Visit: Payer: Self-pay

## 2024-06-30 MED ORDER — SODIUM FLUORIDE 5000 PPM 1.1 % DT PSTE
PASTE | DENTAL | 11 refills | Status: AC
  Filled 2024-06-30: qty 100, 30d supply, fill #0
  Filled 2024-07-13: qty 100, 15d supply, fill #1

## 2024-06-30 MED ORDER — SODIUM FLUORIDE 5000 PPM 1.1 % DT PSTE
PASTE | DENTAL | 1 refills | Status: DC
  Filled 2024-06-30: qty 100, 50d supply, fill #0

## 2024-07-06 ENCOUNTER — Other Ambulatory Visit: Payer: Self-pay

## 2024-07-06 ENCOUNTER — Telehealth: Payer: Self-pay | Admitting: Family Medicine

## 2024-07-06 MED ORDER — GUANFACINE HCL ER 2 MG PO TB24
2.0000 mg | ORAL_TABLET | Freq: Every day | ORAL | 1 refills | Status: AC
  Filled 2024-07-06: qty 30, 30d supply, fill #0
  Filled 2024-08-15: qty 30, 30d supply, fill #1

## 2024-07-06 NOTE — Telephone Encounter (Signed)
 Pt confirmed appt (per vr)

## 2024-07-07 ENCOUNTER — Encounter: Payer: Self-pay | Admitting: Family Medicine

## 2024-07-07 ENCOUNTER — Other Ambulatory Visit: Payer: Self-pay

## 2024-07-07 ENCOUNTER — Ambulatory Visit: Attending: Family Medicine | Admitting: Family Medicine

## 2024-07-07 VITALS — BP 129/72 | HR 61 | Temp 97.8°F | Ht 72.0 in | Wt 202.4 lb

## 2024-07-07 DIAGNOSIS — Z125 Encounter for screening for malignant neoplasm of prostate: Secondary | ICD-10-CM

## 2024-07-07 DIAGNOSIS — I10 Essential (primary) hypertension: Secondary | ICD-10-CM

## 2024-07-07 DIAGNOSIS — F32A Depression, unspecified: Secondary | ICD-10-CM | POA: Diagnosis not present

## 2024-07-07 DIAGNOSIS — E1169 Type 2 diabetes mellitus with other specified complication: Secondary | ICD-10-CM

## 2024-07-07 DIAGNOSIS — F419 Anxiety disorder, unspecified: Secondary | ICD-10-CM

## 2024-07-07 DIAGNOSIS — Z23 Encounter for immunization: Secondary | ICD-10-CM

## 2024-07-07 DIAGNOSIS — E1159 Type 2 diabetes mellitus with other circulatory complications: Secondary | ICD-10-CM | POA: Diagnosis not present

## 2024-07-07 DIAGNOSIS — E785 Hyperlipidemia, unspecified: Secondary | ICD-10-CM | POA: Diagnosis not present

## 2024-07-07 DIAGNOSIS — I152 Hypertension secondary to endocrine disorders: Secondary | ICD-10-CM

## 2024-07-07 LAB — POCT GLYCOSYLATED HEMOGLOBIN (HGB A1C): HbA1c, POC (controlled diabetic range): 6.3 % (ref 0.0–7.0)

## 2024-07-07 NOTE — Patient Instructions (Signed)
 VISIT SUMMARY:  Today, we reviewed your chronic conditions, including hypertension, diabetes, and cholesterol management. We also discussed your mental health and general health maintenance.  YOUR PLAN:  -TYPE 2 DIABETES MELLITUS: Type 2 diabetes is a condition where your body does not use insulin properly, leading to high blood sugar levels. Your A1c has improved from 6.7 to 6.3, which is good. We discussed the side effects of metformin , which you have stopped taking. We will monitor your A1c closely and I recommend reducing starches in your diet and increasing exercise.  -HYPERTENSION: Hypertension, or high blood pressure, is well-controlled with your current increased dosage of losartan . Please continue with your current medication regimen.  -HYPERLIPIDEMIA: Hyperlipidemia means you have high levels of fats (lipids) in your blood, which can increase your risk of heart disease. You are currently taking rosuvastatin  (Crestor ) for this. We will order a fasting lipid panel on Monday, October 27th to check your cholesterol levels. Please continue taking rosuvastatin  as prescribed.  -DEPRESSION AND ANXIETY DISORDER: Your depression and anxiety are well-controlled with your current medications. Please continue with your current psychiatric medication regimen and follow up with your psychiatrist as needed.  -GENERAL HEALTH MAINTENANCE: We discussed the importance of vaccinations and colon cancer screening. You will receive flu and pneumonia vaccinations today. We will discuss colon cancer screening options at your next visit.  INSTRUCTIONS:  Please follow up with a fasting lipid panel on Monday, October 27th. Continue with your current medications and lifestyle recommendations. We will discuss colon cancer screening options at your next visit.

## 2024-07-07 NOTE — Progress Notes (Signed)
 Subjective:  Patient ID: Dylan Burgess, male    DOB: [birth year]  Age: 63 y.o. MRN: [id number]  CC: Medical Management of Chronic Issues     Discussed the use of AI scribe software for clinical note transcription with the patient, who gave verbal consent to proceed.  History of Present Illness Dylan Burgess is a 63 year old male with a history of Anxiety and Depression, HTN, GERD, acute stroke in 12/2023 secondary to left caudate pronto and lacunar infarct (status post TNK)  who presents for follow-up of his chronic conditions.  Hypertension is well-controlled with an increased dosage of losartan  which was performed at his last visit due to an elevated blood pressure. Diabetes management is inconsistent due to fatigue and hunger interfering with metformin  use, leading to discontinuation. Despite this, A1c improved from 6.7 to 6.3. Family history includes diabetes and hypertension. He avoids sweets but has a fondness for chocolate, which he refrains from eating.  He takes lorazepam  as needed for anxiety, with Wellbutrin , propranolol , and guanfacine  prescribed by his psychiatrist. Guanfacine  dosage was recently increased to 2 mg. Anxiety symptoms are well-controlled.  He is compliant with Crestor  for cholesterol management and expresses concern about cholesterol levels.     Past Medical History:  Diagnosis Date   ADHD (attention deficit hyperactivity disorder)    Anxiety    Depression    Hypertension     No past surgical history on file.  Family History  Problem Relation Age of Onset   Kidney disease Mother        was on dialysis at time of death   Diabetes Mother    Hypertension Mother    Hypertension Sister    Kidney disease Brother        kidney failure on dialysis   Hypertension Brother        Cause of kidney failure   Diabetes Brother    Hyperlipidemia Brother    Hyperlipidemia Brother    Obesity Daughter    Colon cancer Neg Hx    Rectal cancer  Neg Hx    Stomach cancer Neg Hx    Esophageal cancer Neg Hx    Pancreatic cancer Neg Hx    Liver cancer Neg Hx     Social History   Socioeconomic History   Marital status: Legally Separated    Spouse name: Not on file   Number of children: 5   Years of education: 13   Highest education level: Associate degree: academic program  Occupational History   Occupation: truck driver--short distance  Tobacco Use   Smoking status: Former    Current packs/day: 0.00    Types: Cigarettes    Start date: 09/21/1997    Quit date: 09/21/2002    Years since quitting: 21.8   Smokeless tobacco: Never  Vaping Use   Vaping status: Never Used  Substance and Sexual Activity   Alcohol use: Not Currently   Drug use: No   Sexual activity: Not on file  Other Topics Concern   Not on file  Social History Narrative   Originally from [place] .   Difficult to get history   Has lived in [place] since 2007.   Lived in [place] since 1990   Children live in [place] or farther away   He currently lives with his girlfriend and 3 dogs.   Social Drivers of Health   Financial Resource Strain: High Risk (07/06/2024)   Overall Financial Resource Strain (CARDIA)    Difficulty of Paying Living  Expenses: Hard  Food Insecurity: Food Insecurity Present (07/06/2024)   Hunger Vital Sign    Worried About Running Out of Food in the Last Year: Sometimes true    Ran Out of Food in the Last Year: Sometimes true  Transportation Needs: Unmet Transportation Needs (07/06/2024)   PRAPARE - Administrator, Civil Service (Medical): Yes    Lack of Transportation (Non-Medical): Yes  Physical Activity: Sufficiently Active (07/06/2024)   Exercise Vital Sign    Days of Exercise per Week: 7 days    Minutes of Exercise per Session: 40 min  Stress: Stress Concern Present (07/06/2024)   [employer] of Occupational Health - Occupational Stress Questionnaire    Feeling of Stress: To some extent  Social  Connections: Socially Isolated (07/06/2024)   Social Connection and Isolation Panel    Frequency of Communication with Friends and Family: Once a week    Frequency of Social Gatherings with Friends and Family: Never    Attends Religious Services: Never    Database administrator or Organizations: No    Attends Engineer, structural: Not on file    Marital Status: Separated    No Known Allergies  Outpatient Medications Prior to Visit  Medication Sig Dispense Refill   amLODipine  (NORVASC ) 5 MG tablet Take 1 tablet (5 mg total) by mouth daily. 90 tablet 1   aspirin  EC 81 MG tablet Take 1 tablet (81 mg total) by mouth daily. Swallow whole. 30 tablet 12   atomoxetine  (STRATTERA ) 40 MG capsule Take 1 capsule (40 mg total) by mouth daily. 90 capsule 0   buPROPion  (WELLBUTRIN  XL) 150 MG 24 hr tablet Take 1 tablet (150 mg total) by mouth in the morning. 90 tablet 0   chlorhexidine  (PERIDEX ) 0.12 % solution Swish 1 capful  for 1 (one) minute then spit, 2 (two) times daily for 10 days. Do not swallow. 473 mL 0   famotidine  (PEPCID ) 40 MG tablet Take 1 tablet (40 mg total) by mouth daily. 30 tablet 1   ibuprofen  (ADVIL ) 800 MG tablet Take 1 tablet every 8 hours as needed for pain 20 tablet 1   losartan  (COZAAR ) 100 MG tablet Take 1 tablet (100 mg total) by mouth daily. 90 tablet 1   metoCLOPramide  (REGLAN ) 10 MG tablet Take 1 tablet (10 mg total) by mouth every 6 (six) hours. 15 tablet 0   naproxen  (NAPROSYN ) 500 MG tablet Take 1 tablet (500 mg total) by mouth 2 (two) times daily. 30 tablet 0   propranolol  (INDERAL ) 10 MG tablet Take 1 tablet by mouth twice daily 180 tablet 0   rosuvastatin  (CRESTOR ) 40 MG tablet Take 1 tablet (40 mg total) by mouth daily. 90 tablet 1   Sodium Fluoride (SODIUM FLUORIDE 5000 PPM) 1.1 % PSTE Apply to teeth in the morning and at bedtime. 112 mL 11   metFORMIN  (GLUCOPHAGE -XR) 500 MG 24 hr tablet Take 1 tablet (500 mg total) by mouth daily with breakfast. 90 tablet  1   guanFACINE  (INTUNIV ) 1 MG TB24 ER tablet Take 1 tablet (1 mg total) by mouth at bedtime. (Patient not taking: Reported on 07/07/2024) 90 tablet 0   guanFACINE  (INTUNIV ) 2 MG TB24 ER tablet Take 1 tablet (2 mg total) by mouth at bedtime. (Patient not taking: Reported on 07/07/2024) 30 tablet 1   LORazepam  (ATIVAN ) 0.5 MG tablet Take 0.5-1 tablets (0.25-0.5 mg total) by mouth 2 (two) times daily as needed. (Patient not taking: Reported on 07/07/2024) 14 tablet  0   LORazepam  (ATIVAN ) 0.5 MG tablet Take 0.5-1 tablets (0.25-0.5 mg total) by mouth daily as needed FOR SEVERE ANXIETY/PANIC. (Patient not taking: Reported on 07/07/2024) 30 tablet 1   doxycycline  (VIBRAMYCIN ) 100 MG capsule Take 1 capsule (100 mg total) by mouth 2 (two) times daily. (Patient not taking: Reported on 07/07/2024) 20 capsule 0   No facility-administered medications prior to visit.     ROS Review of Systems  Constitutional:  Negative for activity change and appetite change.  HENT:  Negative for sinus pressure and sore throat.   Respiratory:  Negative for chest tightness, shortness of breath and wheezing.   Cardiovascular:  Negative for chest pain and palpitations.  Gastrointestinal:  Negative for abdominal distention, abdominal pain and constipation.  Genitourinary: Negative.   Musculoskeletal: Negative.   Psychiatric/Behavioral:  Negative for behavioral problems and dysphoric mood.     Objective:  BP 129/72   Pulse 61   Temp 97.8 F (36.6 C) (Oral)   Ht 6' (1.829 m)   Wt 202 lb 6.4 oz (91.8 kg)   SpO2 98%   BMI 27.45 kg/m      07/07/2024    9:37 AM 05/20/2024    4:39 PM 05/20/2024    1:23 PM  BP/Weight  Systolic BP 129 122 140  Diastolic BP 72 80 87  Wt. (Lbs) 202.4    BMI 27.45 kg/m2        Physical Exam Constitutional:      Appearance: He is well-developed.  Cardiovascular:     Rate and Rhythm: Normal rate.     Heart sounds: Normal heart sounds. No murmur heard. Pulmonary:     Effort:  Pulmonary effort is normal.     Breath sounds: Normal breath sounds. No wheezing or rales.  Chest:     Chest wall: No tenderness.  Abdominal:     General: Bowel sounds are normal. There is no distension.     Palpations: Abdomen is soft. There is no mass.     Tenderness: There is no abdominal tenderness.  Musculoskeletal:        General: Normal range of motion.     Right lower leg: No edema.     Left lower leg: No edema.  Neurological:     Mental Status: He is alert and oriented to person, place, and time.  Psychiatric:        Mood and Affect: Mood normal.        Latest Ref Rng & Units 03/24/2024    7:41 AM 01/09/2024    4:07 AM 01/08/2024    1:09 PM  CMP  Glucose 70 - 99 mg/dL 878  891  849   BUN 6 - 20 mg/dL 12  8  12    Creatinine 0.61 - 1.24 mg/dL 8.89  9.01  8.89   Sodium 135 - 145 mmol/L 136  138  139   Potassium 3.5 - 5.1 mmol/L 3.7  3.3  3.9   Chloride 98 - 111 mmol/L 101  103  103   CO2 22 - 32 mmol/L 26  25    Calcium  8.9 - 10.3 mg/dL 8.8  8.5    Total Protein 6.5 - 8.1 g/dL  6.8    Total Bilirubin 0.0 - 1.2 mg/dL  0.8    Alkaline Phos 38 - 126 U/L  64    AST 15 - 41 U/L  16    ALT 0 - 44 U/L  14      Lipid Panel  Component Value Date/Time   CHOL 165 01/09/2024 0407   CHOL 204 (H) 12/24/2022 1023   TRIG 68 01/09/2024 0407   HDL 41 01/09/2024 0407   HDL 44 12/24/2022 1023   CHOLHDL 4.0 01/09/2024 0407   VLDL 14 01/09/2024 0407   LDLCALC 110 (H) 01/09/2024 0407   LDLCALC 144 (H) 12/24/2022 1023   LDLDIRECT 112 (H) 01/11/2009 2207    CBC    Component Value Date/Time   WBC 20.5 (H) 03/24/2024 0741   RBC 5.55 03/24/2024 0741   HGB 15.1 03/24/2024 0741   HGB 15.3 04/21/2018 1150   HCT 46.5 03/24/2024 0741   HCT 45.6 04/21/2018 1150   PLT 280 03/24/2024 0741   PLT 297 04/21/2018 1150   MCV 83.8 03/24/2024 0741   MCV 85 04/21/2018 1150   MCH 27.2 03/24/2024 0741   MCHC 32.5 03/24/2024 0741   RDW 13.3 03/24/2024 0741   RDW 14.5 04/21/2018 1150    LYMPHSABS 1.5 03/24/2024 0741   LYMPHSABS 2.1 04/21/2018 1150   MONOABS 1.7 (H) 03/24/2024 0741   EOSABS 0.1 03/24/2024 0741   EOSABS 0.2 04/21/2018 1150   BASOSABS 0.0 03/24/2024 0741   BASOSABS 0.0 04/21/2018 1150    Lab Results  Component Value Date   HGBA1C 6.3 07/07/2024    Lab Results  Component Value Date   HGBA1C 6.3 07/07/2024   HGBA1C 6.7 04/06/2024   HGBA1C 6.5 (H) 01/08/2024       Assessment & Plan Type 2 diabetes mellitus A1c improved from 6.7 to 6.3. Discontinued metformin  due to side effects.  - Monitor A1c closely. - Advise dietary modifications: reduce starches, increase exercise. - Discussed metformin  side effects and discontinuation.  Hypertension associated with type 2 diabetes mellitus Increased losartan  improved blood pressure control. - Continue current antihypertensive regimen. -Counseled on blood pressure goal of less than 130/80, low-sodium, DASH diet, medication compliance, 150 minutes of moderate intensity exercise per week. Discussed medication compliance, adverse effects.   Hyperlipidemia associated with type 2 diabetes mellitus On rosuvastatin . Previous cholesterol elevation. - Order fasting lipid panel on Monday, October 27th. - Continue rosuvastatin  as prescribed.  Depression and anxiety disorder No recent anxiety episodes, effective symptom control. - Continue current psychiatric medication regimen. - Follow up with psychiatrist.  General Health Maintenance Discussed vaccinations and colon cancer screening. Hesitant about colonoscopy. - Administer flu and pneumonia vaccinations. - Discuss colon cancer screening options at next visit.    Healthcare maintenance Screening for prostate cancer-PSA ordered Need for vaccination against influenza and pneumonia -PCV 20 and flu shot administered  No orders of the defined types were placed in this encounter.   Follow-up: Return in about 6 months (around 01/05/2025) for Chronic medical  conditions.       Corrina Sabin, MD, FAAFP. [facility] [place], [place] [phone]   07/07/2024, 1:41 PM

## 2024-07-11 ENCOUNTER — Ambulatory Visit: Attending: Family Medicine

## 2024-07-11 DIAGNOSIS — E1169 Type 2 diabetes mellitus with other specified complication: Secondary | ICD-10-CM

## 2024-07-11 DIAGNOSIS — Z125 Encounter for screening for malignant neoplasm of prostate: Secondary | ICD-10-CM

## 2024-07-12 ENCOUNTER — Telehealth: Payer: Self-pay | Admitting: Neurology

## 2024-07-12 NOTE — Telephone Encounter (Signed)
 Patient rescheduling due to scheduling conflict

## 2024-07-13 ENCOUNTER — Other Ambulatory Visit: Payer: Self-pay

## 2024-07-13 ENCOUNTER — Ambulatory Visit: Admitting: Neurology

## 2024-07-14 ENCOUNTER — Ambulatory Visit: Payer: Self-pay | Admitting: Family Medicine

## 2024-07-14 ENCOUNTER — Other Ambulatory Visit: Payer: Self-pay

## 2024-07-14 LAB — CMP14+EGFR
ALT: 15 IU/L (ref 0–44)
AST: 19 IU/L (ref 0–40)
Albumin: 4.3 g/dL (ref 3.8–4.9)
Alkaline Phosphatase: 96 IU/L (ref 47–123)
BUN/Creatinine Ratio: 10 (ref 10–24)
BUN: 11 mg/dL (ref 8–27)
Bilirubin Total: 0.4 mg/dL (ref 0.0–1.2)
CO2: 24 mmol/L (ref 20–29)
Calcium: 8.9 mg/dL (ref 8.6–10.2)
Chloride: 106 mmol/L (ref 96–106)
Creatinine, Ser: 1.08 mg/dL (ref 0.76–1.27)
Globulin, Total: 2.5 g/dL (ref 1.5–4.5)
Glucose: 72 mg/dL (ref 70–99)
Potassium: 4.2 mmol/L (ref 3.5–5.2)
Sodium: 144 mmol/L (ref 134–144)
Total Protein: 6.8 g/dL (ref 6.0–8.5)
eGFR: 79 mL/min/1.73 (ref >59)

## 2024-07-14 LAB — PSA, TOTAL AND FREE
PSA, Free Pct: 25.7 %
PSA, Free: 0.18 ng/mL
Prostate Specific Ag, Serum: 0.7 ng/mL (ref 0.0–4.0)

## 2024-07-14 LAB — LP+NON-HDL CHOLESTEROL
Cholesterol, Total: 110 mg/dL (ref 100–199)
HDL: 43 mg/dL (ref >39)
LDL Chol Calc (NIH): 48 mg/dL (ref 0–99)
Total Non-HDL-Chol (LDL+VLDL): 67 mg/dL (ref 0–129)
Triglycerides: 102 mg/dL (ref 0–149)
VLDL Cholesterol Cal: 19 mg/dL (ref 5–40)

## 2024-07-14 LAB — MICROALBUMIN / CREATININE URINE RATIO

## 2024-08-06 ENCOUNTER — Other Ambulatory Visit: Payer: Self-pay

## 2024-08-06 ENCOUNTER — Emergency Department (HOSPITAL_COMMUNITY): Admission: EM | Admit: 2024-08-06 | Discharge: 2024-08-07 | Discharge status: Against Medical Advice

## 2024-08-06 ENCOUNTER — Emergency Department (HOSPITAL_COMMUNITY)

## 2024-08-06 ENCOUNTER — Encounter (HOSPITAL_COMMUNITY): Payer: Self-pay | Admitting: *Deleted

## 2024-08-06 DIAGNOSIS — R079 Chest pain, unspecified: Secondary | ICD-10-CM | POA: Insufficient documentation

## 2024-08-06 DIAGNOSIS — Z5321 Procedure and treatment not carried out due to patient leaving prior to being seen by health care provider: Secondary | ICD-10-CM | POA: Insufficient documentation

## 2024-08-06 LAB — CBC
HCT: 45.2 % (ref 39.0–52.0)
Hemoglobin: 14.4 g/dL (ref 13.0–17.0)
MCH: 26.8 pg (ref 26.0–34.0)
MCHC: 31.9 g/dL (ref 30.0–36.0)
MCV: 84 fL (ref 80.0–100.0)
Platelets: 259 K/uL (ref 150–400)
RBC: 5.38 MIL/uL (ref 4.22–5.81)
RDW: 13.6 % (ref 11.5–15.5)
WBC: 8 K/uL (ref 4.0–10.5)
nRBC: 0 % (ref 0.0–0.2)

## 2024-08-06 LAB — HEPATIC FUNCTION PANEL
ALT: 16 U/L (ref 0–44)
AST: 21 U/L (ref 15–41)
Albumin: 3.5 g/dL (ref 3.5–5.0)
Alkaline Phosphatase: 81 U/L (ref 38–126)
Bilirubin, Direct: 0.1 mg/dL (ref 0.0–0.2)
Indirect Bilirubin: 0.2 mg/dL — ABNORMAL LOW (ref 0.3–0.9)
Total Bilirubin: 0.3 mg/dL (ref 0.0–1.2)
Total Protein: 7.1 g/dL (ref 6.5–8.1)

## 2024-08-06 LAB — LIPASE, BLOOD: Lipase: 45 U/L (ref 11–51)

## 2024-08-06 LAB — BASIC METABOLIC PANEL WITH GFR
Anion gap: 10 (ref 5–15)
BUN: 12 mg/dL (ref 6–20)
CO2: 23 mmol/L (ref 22–32)
Calcium: 8.2 mg/dL — ABNORMAL LOW (ref 8.9–10.3)
Chloride: 106 mmol/L (ref 98–111)
Creatinine, Ser: 1.07 mg/dL (ref 0.61–1.24)
GFR, Estimated: >60 mL/min (ref >60)
Glucose, Bld: 156 mg/dL — ABNORMAL HIGH (ref 70–99)
Potassium: 3.6 mmol/L (ref 3.5–5.1)
Sodium: 139 mmol/L (ref 135–145)

## 2024-08-06 LAB — TROPONIN I (HIGH SENSITIVITY): Troponin I (High Sensitivity): 6 ng/L (ref <18)

## 2024-08-06 NOTE — ED Triage Notes (Signed)
 [facility], burning in sensation that started 2 hours ago. Hx of indigestion in the past however states this feeling is much worse. 18 g IV to left forearm. EMS gave 324mg  ASA enroute without improvement. EMS VS 138/76, pulse 60, SPO2 99%

## 2024-08-07 LAB — TROPONIN I (HIGH SENSITIVITY): Troponin I (High Sensitivity): 6 ng/L (ref <18)

## 2024-08-07 NOTE — ED Notes (Signed)
 Called PT twice, no response.SABRASABRA

## 2024-08-15 ENCOUNTER — Other Ambulatory Visit: Payer: Self-pay

## 2024-08-18 ENCOUNTER — Other Ambulatory Visit: Payer: Self-pay

## 2024-08-18 MED ORDER — PROPRANOLOL HCL 10 MG PO TABS
10.0000 mg | ORAL_TABLET | Freq: Two times a day (BID) | ORAL | 0 refills | Status: AC
  Filled 2024-08-18: qty 180, 90d supply, fill #0

## 2024-08-19 ENCOUNTER — Other Ambulatory Visit: Payer: Self-pay

## 2024-09-01 ENCOUNTER — Other Ambulatory Visit: Payer: Self-pay

## 2024-09-05 ENCOUNTER — Other Ambulatory Visit: Payer: Self-pay

## 2024-09-05 MED ORDER — QELBREE 200 MG PO CP24
200.0000 mg | ORAL_CAPSULE | Freq: Every morning | ORAL | 1 refills | Status: AC
  Filled 2024-09-05 (×2): qty 30, 30d supply, fill #0
  Filled 2024-10-12: qty 30, 30d supply, fill #1

## 2024-09-05 MED ORDER — BUPROPION HCL ER (XL) 150 MG PO TB24
150.0000 mg | ORAL_TABLET | Freq: Every morning | ORAL | 0 refills | Status: AC
  Filled 2024-09-05: qty 90, 90d supply, fill #0

## 2024-09-09 ENCOUNTER — Other Ambulatory Visit: Payer: Self-pay

## 2024-09-14 ENCOUNTER — Other Ambulatory Visit: Payer: Self-pay

## 2024-09-14 ENCOUNTER — Encounter (HOSPITAL_COMMUNITY): Payer: Self-pay

## 2024-09-14 ENCOUNTER — Emergency Department (HOSPITAL_COMMUNITY)
Admission: EM | Admit: 2024-09-14 | Discharge: 2024-09-14 | Discharge status: Against Medical Advice | Attending: Emergency Medicine | Admitting: Emergency Medicine

## 2024-09-14 ENCOUNTER — Emergency Department (HOSPITAL_COMMUNITY)

## 2024-09-14 DIAGNOSIS — Z5900 Homelessness unspecified: Secondary | ICD-10-CM | POA: Insufficient documentation

## 2024-09-14 DIAGNOSIS — J069 Acute upper respiratory infection, unspecified: Secondary | ICD-10-CM | POA: Insufficient documentation

## 2024-09-14 DIAGNOSIS — Z5321 Procedure and treatment not carried out due to patient leaving prior to being seen by health care provider: Secondary | ICD-10-CM | POA: Insufficient documentation

## 2024-09-14 DIAGNOSIS — R519 Headache, unspecified: Secondary | ICD-10-CM | POA: Diagnosis present

## 2024-09-14 LAB — CBC WITH DIFFERENTIAL/PLATELET
Abs Immature Granulocytes: 0.03 K/uL (ref 0.00–0.07)
Basophils Absolute: 0 K/uL (ref 0.0–0.1)
Basophils Relative: 0 %
Eosinophils Absolute: 0.1 K/uL (ref 0.0–0.5)
Eosinophils Relative: 1 %
HCT: 48.2 % (ref 39.0–52.0)
Hemoglobin: 16 g/dL (ref 13.0–17.0)
Immature Granulocytes: 0 %
Lymphocytes Relative: 9 %
Lymphs Abs: 0.7 K/uL (ref 0.7–4.0)
MCH: 27.3 pg (ref 26.0–34.0)
MCHC: 33.2 g/dL (ref 30.0–36.0)
MCV: 82.3 fL (ref 80.0–100.0)
Monocytes Absolute: 1.5 K/uL — ABNORMAL HIGH (ref 0.1–1.0)
Monocytes Relative: 17 %
Neutro Abs: 6.3 K/uL (ref 1.7–7.7)
Neutrophils Relative %: 73 %
Platelets: 267 K/uL (ref 150–400)
RBC: 5.86 MIL/uL — ABNORMAL HIGH (ref 4.22–5.81)
RDW: 13.2 % (ref 11.5–15.5)
WBC: 8.7 K/uL (ref 4.0–10.5)
nRBC: 0 % (ref 0.0–0.2)

## 2024-09-14 LAB — COMPREHENSIVE METABOLIC PANEL WITH GFR
ALT: 19 U/L (ref 0–44)
AST: 26 U/L (ref 15–41)
Albumin: 4.4 g/dL (ref 3.5–5.0)
Alkaline Phosphatase: 80 U/L (ref 38–126)
Anion gap: 12 (ref 5–15)
BUN: 13 mg/dL (ref 8–23)
CO2: 23 mmol/L (ref 22–32)
Calcium: 9 mg/dL (ref 8.9–10.3)
Chloride: 101 mmol/L (ref 98–111)
Creatinine, Ser: 1.04 mg/dL (ref 0.61–1.24)
GFR, Estimated: >60 mL/min
Glucose, Bld: 114 mg/dL — ABNORMAL HIGH (ref 70–99)
Potassium: 3.6 mmol/L (ref 3.5–5.1)
Sodium: 136 mmol/L (ref 135–145)
Total Bilirubin: 0.5 mg/dL (ref 0.0–1.2)
Total Protein: 7.7 g/dL (ref 6.5–8.1)

## 2024-09-14 LAB — LIPASE, BLOOD: Lipase: 58 U/L — ABNORMAL HIGH (ref 11–51)

## 2024-09-14 LAB — RESP PANEL BY RT-PCR (RSV, FLU A&B, COVID)  RVPGX2
Influenza A by PCR: POSITIVE — AB
Influenza B by PCR: NEGATIVE
Resp Syncytial Virus by PCR: NEGATIVE
SARS Coronavirus 2 by RT PCR: NEGATIVE

## 2024-09-14 MED ORDER — ACETAMINOPHEN 325 MG PO TABS
650.0000 mg | ORAL_TABLET | Freq: Once | ORAL | Status: AC
  Administered 2024-09-14: 650 mg via ORAL
  Filled 2024-09-14: qty 2

## 2024-09-14 NOTE — ED Notes (Signed)
 Pt called in lobby for vitals with no response x2.

## 2024-09-14 NOTE — ED Triage Notes (Addendum)
 Coming from [facility] and complains sinus pain and rib pain   140/72 98%  74HR  CBG 130

## 2024-09-14 NOTE — ED Provider Triage Note (Signed)
 Emergency Medicine Provider Triage Evaluation Note  Dylan Burgess , a 63 y.o. male  was evaluated in triage.  Pt complains of facial pain, rib pain and abdominal pain the last two days. Felt feverish. Denies cough, congestion, sore throat, N/V/D or urinary symptoms.   Review of Systems  Positive: Facial pain, abd pain Negative: N/V/D, cough, congestion, sore throat  Physical Exam  BP 126/76 (BP Location: Left Arm)   Pulse 73   Temp 98.7 F (37.1 C) (Oral)   Resp 18   Ht 6' (1.829 m)   Wt 91.8 kg   SpO2 95%   BMI 27.45 kg/m  Gen:   Awake, no distress   Resp:  Normal effort, lungs CTAB MSK:   Moves extremities without difficulty  Other:  No sinus tenderness to percussion, abdominal tenderness across upper abdomen, no rebound or guarding  Medical Decision Making  Medically screening exam initiated at 8:41 AM.  Appropriate orders placed.  Dylan Burgess was informed that the remainder of the evaluation will be completed by another provider, this initial triage assessment does not replace that evaluation, and the importance of remaining in the ED until their evaluation is complete.     Dylan Burgess, Dylan Belmar K, DO 09/14/24 [id number]

## 2024-09-23 ENCOUNTER — Emergency Department (HOSPITAL_COMMUNITY)

## 2024-09-23 ENCOUNTER — Other Ambulatory Visit: Payer: Self-pay

## 2024-09-23 ENCOUNTER — Emergency Department (HOSPITAL_COMMUNITY)
Admission: EM | Admit: 2024-09-23 | Discharge: 2024-09-24 | Disposition: A | Attending: Emergency Medicine | Admitting: Emergency Medicine

## 2024-09-23 DIAGNOSIS — Z7982 Long term (current) use of aspirin: Secondary | ICD-10-CM | POA: Insufficient documentation

## 2024-09-23 DIAGNOSIS — Z23 Encounter for immunization: Secondary | ICD-10-CM | POA: Diagnosis not present

## 2024-09-23 DIAGNOSIS — W260XXA Contact with knife, initial encounter: Secondary | ICD-10-CM | POA: Insufficient documentation

## 2024-09-23 DIAGNOSIS — S6991XA Unspecified injury of right wrist, hand and finger(s), initial encounter: Secondary | ICD-10-CM | POA: Diagnosis present

## 2024-09-23 DIAGNOSIS — I10 Essential (primary) hypertension: Secondary | ICD-10-CM | POA: Diagnosis not present

## 2024-09-23 DIAGNOSIS — Z79899 Other long term (current) drug therapy: Secondary | ICD-10-CM | POA: Diagnosis not present

## 2024-09-23 DIAGNOSIS — S61212A Laceration without foreign body of right middle finger without damage to nail, initial encounter: Secondary | ICD-10-CM | POA: Diagnosis not present

## 2024-09-23 NOTE — ED Triage Notes (Signed)
 Patient accidentally cut his right distal middle finger with a knife this evening , presents with approx. 1 horizontal skin laceration with mild bleeding , dressing applied at arrival .

## 2024-09-23 NOTE — ED Provider Triage Note (Signed)
 Emergency Medicine Provider Triage Evaluation Note  Dylan Burgess , a 64 y.o. male  was evaluated in triage.  Pt complains of right middle finger LAC at DIP.  Patient denies blood thinner use  Review of Systems  Positive: Laceration, bleeding, pain Negative: Numbness, weakness  Physical Exam  There were no vitals taken for this visit. Gen:   Awake, no distress   Resp:  Normal effort  MSK:   Moves extremities without difficulty  Other:  Approximately 1-1/2 cm laceration at the right middle finger DIP.  Patient is neurovascularly intact.  Medical Decision Making  Medically screening exam initiated at 9:28 PM.  Appropriate orders placed.  Dylan Burgess was informed that the remainder of the evaluation will be completed by another provider, this initial triage assessment does not replace that evaluation, and the importance of remaining in the ED until their evaluation is complete.  Imaging ordered   Dylan Burgess 09/23/24 2129

## 2024-09-24 MED ORDER — LIDOCAINE HCL (PF) 1 % IJ SOLN
5.0000 mL | Freq: Once | INTRAMUSCULAR | Status: AC
  Administered 2024-09-24: 5 mL
  Filled 2024-09-24: qty 5

## 2024-09-24 MED ORDER — TETANUS-DIPHTH-ACELL PERTUSSIS 5-2-15.5 LF-MCG/0.5 IM SUSP
0.5000 mL | Freq: Once | INTRAMUSCULAR | Status: AC
  Administered 2024-09-24: 0.5 mL via INTRAMUSCULAR
  Filled 2024-09-24: qty 0.5

## 2024-09-24 NOTE — Discharge Instructions (Addendum)
 Your stitches should dissolve in about 7 days you should be able to just pull them out.  If they still seem tight wait a few days and try to pull them again.  It is okay to wash your wound but do not soak it.  Keep it covered while you are at work.  Use Tylenol  or ibuprofen  as needed for pain.  Your x-ray today was normal.

## 2024-09-24 NOTE — ED Provider Notes (Signed)
 " [place] EMERGENCY DEPARTMENT AT [facility] Provider Note   CSN: [id number] Arrival date & time: 09/23/24  2124     Patient presents with: Finger Laceration  (Right Middle)   Dylan Burgess is a 64 y.o. male.   Patient is a 64 year old male with a history of hypertension who is presenting today with injury to the right middle finger.  He reports his hand slipped and a knife cut him on the top of his finger.  No nail involvement.  No other areas of injury.  He is unsure when his last tetanus shot was.  The history is provided by the patient.       Prior to Admission medications  Medication Sig Start Date End Date Taking? Authorizing Provider  amLODipine  (NORVASC ) 5 MG tablet Take 1 tablet (5 mg total) by mouth daily. 04/06/24   Newlin, Enobong, MD  aspirin  EC 81 MG tablet Take 1 tablet (81 mg total) by mouth daily. Swallow whole. 01/11/24   Judithe Rocky BROCKS, NP  atomoxetine  (STRATTERA ) 40 MG capsule Take 1 capsule (40 mg total) by mouth daily. 08/26/23     buPROPion  (WELLBUTRIN  XL) 150 MG 24 hr tablet Take 1 tablet (150 mg total) by mouth in the morning. 09/05/24     chlorhexidine  (PERIDEX ) 0.12 % solution Swish 1 capful  for 1 (one) minute then spit, 2 (two) times daily for 10 days. Do not swallow. 01/20/24     famotidine  (PEPCID ) 40 MG tablet Take 1 tablet (40 mg total) by mouth daily. 12/10/23   Regalado, Belkys A, MD  guanFACINE  (INTUNIV ) 1 MG TB24 ER tablet Take 1 tablet (1 mg total) by mouth at bedtime. Patient not taking: Reported on 07/07/2024 04/26/24     guanFACINE  (INTUNIV ) 2 MG TB24 ER tablet Take 1 tablet (2 mg total) by mouth at bedtime. Patient not taking: Reported on 07/07/2024 07/06/24     ibuprofen  (ADVIL ) 800 MG tablet Take 1 tablet every 8 hours as needed for pain 01/20/24     LORazepam  (ATIVAN ) 0.5 MG tablet Take 0.5-1 tablets (0.25-0.5 mg total) by mouth 2 (two) times daily as needed. Patient not taking: Reported on 07/07/2024 10/07/23     LORazepam   (ATIVAN ) 0.5 MG tablet Take 0.5-1 tablets (0.25-0.5 mg total) by mouth daily as needed FOR SEVERE ANXIETY/PANIC. Patient not taking: Reported on 07/07/2024 04/29/24     losartan  (COZAAR ) 100 MG tablet Take 1 tablet (100 mg total) by mouth daily. 04/06/24   Newlin, Enobong, MD  metoCLOPramide  (REGLAN ) 10 MG tablet Take 1 tablet (10 mg total) by mouth every 6 (six) hours. 03/24/24   Bauer, Collin S, PA-C  naproxen  (NAPROSYN ) 500 MG tablet Take 1 tablet (500 mg total) by mouth 2 (two) times daily. 03/24/24   Bauer, Collin S, PA-C  propranolol  (INDERAL ) 10 MG tablet Take 1 tablet by mouth twice daily 08/18/24     rosuvastatin  (CRESTOR ) 40 MG tablet Take 1 tablet (40 mg total) by mouth daily. 04/06/24   Newlin, Enobong, MD  Sodium Fluoride  (SODIUM FLUORIDE  5000 PPM) 1.1 % PSTE Apply to teeth in the morning and at bedtime. 06/30/24     viloxazine ER (QELBREE ) 200 MG 24 hr capsule Take 1 capsule (200 mg total) by mouth every morning. 09/05/24     metoprolol  tartrate (LOPRESSOR ) 25 MG tablet Take 1 tablet (25 mg total) by mouth 2 (two) times daily. Patient not taking: Reported on 03/15/2019 04/21/18 01/20/20  Adella Norris, MD    Allergies: Patient  has no known allergies.    Review of Systems  Updated Vital Signs BP 127/88 (BP Location: Left Arm)   Pulse 70   Temp 97.7 F (36.5 C)   Resp 15   SpO2 92%   Physical Exam Vitals and nursing note reviewed.  Musculoskeletal:       Hands:     Comments: 2 cm laceration present over the right third dorsal finger.  Near the DIP joint.  Patient does have full flexion and extension of the DIP and PIP joint of that finger.  Sensation is intact.  Nail is intact.  Skin:    General: Skin is warm.  Neurological:     Mental Status: Mental status is at baseline.     (all labs ordered are listed, but only abnormal results are displayed) Labs Reviewed - No data to display  EKG: None  Radiology: DG Finger Middle Right Result Date: 09/23/2024 EXAM: 3 VIEW(S)  XRAY OF THE FINGER(S) 09/23/2024 09:51:11 PM COMPARISON: None available. CLINICAL HISTORY: lac FINDINGS: BONES AND JOINTS: No acute fracture. No malalignment. SOFT TISSUES: Mild soft tissue swelling along the dorsal aspect of the DIP joint. No radiopaque foreign body is seen. IMPRESSION: 1. No radiopaque foreign body. Electronically signed by: Pinkie Pebbles MD MD 09/23/2024 09:57 PM EST RP Workstation: [id number]     Procedures  LACERATION REPAIR Performed by: [facility] Authorized by: Benton Shone Consent: Verbal consent obtained. Risks and benefits: risks, benefits and alternatives were discussed Consent given by: patient Patient identity confirmed: provided demographic data Prepped and Draped in normal sterile fashion Wound explored  Laceration Location: right 3rd finger  Laceration Length: 2cm  No Foreign Bodies seen or palpated  Anesthesia:digital block  Local anesthetic: lidocaine  1% without epinephrine   Anesthetic total: 3 ml  Irrigation method: syringe Amount of cleaning: standard  Skin closure: 4.0 vicryl rapide  Number of sutures: 3  Technique: simple interrupted  Patient tolerance: Patient tolerated the procedure well with no immediate complications.  Medications Ordered in the ED  lidocaine  (PF) (XYLOCAINE ) 1 % injection 5 mL (5 mLs Infiltration Given 09/24/24 0740)  Tdap (ADACEL ) injection 0.5 mL (0.5 mLs Intramuscular Given 09/24/24 0725)                                    Medical Decision Making Amount and/or Complexity of Data Reviewed Radiology: ordered and independent interpretation performed. Decision-making details documented in ED Course.  Risk Prescription drug management.   Patient presenting today after injuring his finger with a knife.  Laceration present on the dorsal aspect of the finger but appears to have no tendon injury at this time and has full flexion extension of the DIP joint.  Tetanus shot was updated.  Wound  repaired as above. I have independently visualized and interpreted pt's images today.  Finger x-ray wnl.    Final diagnoses:  Laceration of right middle finger without foreign body without damage to nail, initial encounter    ED Discharge Orders     None          Shone [place], MD 09/24/24 [phone]  "

## 2024-09-27 ENCOUNTER — Other Ambulatory Visit: Payer: Self-pay

## 2024-09-27 ENCOUNTER — Encounter (HOSPITAL_COMMUNITY): Payer: Self-pay

## 2024-09-27 ENCOUNTER — Emergency Department (HOSPITAL_COMMUNITY)
Admission: EM | Admit: 2024-09-27 | Discharge: 2024-09-28 | Disposition: A | Discharge status: Home / Self Care | Attending: Emergency Medicine | Admitting: Emergency Medicine

## 2024-09-27 DIAGNOSIS — Z7982 Long term (current) use of aspirin: Secondary | ICD-10-CM | POA: Insufficient documentation

## 2024-09-27 DIAGNOSIS — M791 Myalgia, unspecified site: Secondary | ICD-10-CM | POA: Diagnosis present

## 2024-09-27 DIAGNOSIS — B349 Viral infection, unspecified: Secondary | ICD-10-CM | POA: Diagnosis not present

## 2024-09-27 LAB — CBC WITH DIFFERENTIAL/PLATELET
Abs Immature Granulocytes: 0.03 K/uL (ref 0.00–0.07)
Basophils Absolute: 0 K/uL (ref 0.0–0.1)
Basophils Relative: 0 %
Eosinophils Absolute: 0.2 K/uL (ref 0.0–0.5)
Eosinophils Relative: 3 %
HCT: 46.9 % (ref 39.0–52.0)
Hemoglobin: 15.4 g/dL (ref 13.0–17.0)
Immature Granulocytes: 0 %
Lymphocytes Relative: 29 %
Lymphs Abs: 2.5 K/uL (ref 0.7–4.0)
MCH: 27.2 pg (ref 26.0–34.0)
MCHC: 32.8 g/dL (ref 30.0–36.0)
MCV: 82.9 fL (ref 80.0–100.0)
Monocytes Absolute: 1.1 K/uL — ABNORMAL HIGH (ref 0.1–1.0)
Monocytes Relative: 12 %
Neutro Abs: 4.8 K/uL (ref 1.7–7.7)
Neutrophils Relative %: 56 %
Platelets: 291 K/uL (ref 150–400)
RBC: 5.66 MIL/uL (ref 4.22–5.81)
RDW: 13.2 % (ref 11.5–15.5)
WBC: 8.7 K/uL (ref 4.0–10.5)
nRBC: 0 % (ref 0.0–0.2)

## 2024-09-27 LAB — BASIC METABOLIC PANEL WITH GFR
Anion gap: 10 (ref 5–15)
BUN: 12 mg/dL (ref 8–23)
CO2: 27 mmol/L (ref 22–32)
Calcium: 8.9 mg/dL (ref 8.9–10.3)
Chloride: 102 mmol/L (ref 98–111)
Creatinine, Ser: 1.1 mg/dL (ref 0.61–1.24)
GFR, Estimated: >60 mL/min
Glucose, Bld: 141 mg/dL — ABNORMAL HIGH (ref 70–99)
Potassium: 3.6 mmol/L (ref 3.5–5.1)
Sodium: 139 mmol/L (ref 135–145)

## 2024-09-27 LAB — RESP PANEL BY RT-PCR (RSV, FLU A&B, COVID)  RVPGX2
Influenza A by PCR: NEGATIVE
Influenza B by PCR: NEGATIVE
Resp Syncytial Virus by PCR: NEGATIVE
SARS Coronavirus 2 by RT PCR: NEGATIVE

## 2024-09-27 NOTE — ED Triage Notes (Signed)
 Pt arrive POV c/o flu like symptoms with generalized body ache, cough and fatigue.

## 2024-09-27 NOTE — ED Triage Notes (Addendum)
 Pt to ED c/o chills, body aches, congestion x 1 day, reports known sick contacts,  per chart review tested positive for flu 12/31

## 2024-09-28 ENCOUNTER — Other Ambulatory Visit: Payer: Self-pay

## 2024-09-28 MED ORDER — ACETAMINOPHEN 325 MG PO TABS: 650.0000 mg | ORAL_TABLET | Freq: Once | ORAL | Status: DC

## 2024-09-28 MED ORDER — ACETAMINOPHEN 500 MG PO TABS
500.0000 mg | ORAL_TABLET | Freq: Four times a day (QID) | ORAL | 0 refills | Status: AC | PRN
  Filled 2024-09-28: qty 30, 8d supply, fill #0

## 2024-09-28 NOTE — ED Provider Notes (Signed)
 " [place] [facility] Provider Note   CSN: [id number] Arrival date & time: 09/27/24  2148     Patient presents with: Generalized Body Aches   Dylan Burgess is a 64 y.o. male.  Presents today for chills, body aches, and congestion x 1 day.  Patient reports recent sick contacts.  Patient denies fever, nausea, vomiting, chest pain, shortness of breath, any other complaints at this time.   HPI     Prior to Admission medications  Medication Sig Start Date End Date Taking? Authorizing Provider  acetaminophen  (TYLENOL ) 500 MG tablet Take 1 tablet (500 mg total) by mouth every 6 (six) hours as needed. 09/28/24  Yes Rafel Garde N, PA-C  amLODipine  (NORVASC ) 5 MG tablet Take 1 tablet (5 mg total) by mouth daily. 04/06/24   Newlin, Enobong, MD  aspirin  EC 81 MG tablet Take 1 tablet (81 mg total) by mouth daily. Swallow whole. 01/11/24   Judithe Rocky BROCKS, NP  atomoxetine  (STRATTERA ) 40 MG capsule Take 1 capsule (40 mg total) by mouth daily. 08/26/23     buPROPion  (WELLBUTRIN  XL) 150 MG 24 hr tablet Take 1 tablet (150 mg total) by mouth in the morning. 09/05/24     chlorhexidine  (PERIDEX ) 0.12 % solution Swish 1 capful  for 1 (one) minute then spit, 2 (two) times daily for 10 days. Do not swallow. 01/20/24     famotidine  (PEPCID ) 40 MG tablet Take 1 tablet (40 mg total) by mouth daily. 12/10/23   Regalado, Belkys A, MD  guanFACINE  (INTUNIV ) 1 MG TB24 ER tablet Take 1 tablet (1 mg total) by mouth at bedtime. Patient not taking: Reported on 07/07/2024 04/26/24     guanFACINE  (INTUNIV ) 2 MG TB24 ER tablet Take 1 tablet (2 mg total) by mouth at bedtime. Patient not taking: Reported on 07/07/2024 07/06/24     ibuprofen  (ADVIL ) 800 MG tablet Take 1 tablet every 8 hours as needed for pain 01/20/24     LORazepam  (ATIVAN ) 0.5 MG tablet Take 0.5-1 tablets (0.25-0.5 mg total) by mouth 2 (two) times daily as needed. Patient not taking: Reported on 07/07/2024 10/07/23      LORazepam  (ATIVAN ) 0.5 MG tablet Take 0.5-1 tablets (0.25-0.5 mg total) by mouth daily as needed FOR SEVERE ANXIETY/PANIC. Patient not taking: Reported on 07/07/2024 04/29/24     losartan  (COZAAR ) 100 MG tablet Take 1 tablet (100 mg total) by mouth daily. 04/06/24   Newlin, Enobong, MD  metoCLOPramide  (REGLAN ) 10 MG tablet Take 1 tablet (10 mg total) by mouth every 6 (six) hours. 03/24/24   Bauer, Collin S, PA-C  naproxen  (NAPROSYN ) 500 MG tablet Take 1 tablet (500 mg total) by mouth 2 (two) times daily. 03/24/24   Bauer, Collin S, PA-C  propranolol  (INDERAL ) 10 MG tablet Take 1 tablet by mouth twice daily 08/18/24     rosuvastatin  (CRESTOR ) 40 MG tablet Take 1 tablet (40 mg total) by mouth daily. 04/06/24   Newlin, Enobong, MD  Sodium Fluoride  (SODIUM FLUORIDE  5000 PPM) 1.1 % PSTE Apply to teeth in the morning and at bedtime. 06/30/24     viloxazine ER (QELBREE ) 200 MG 24 hr capsule Take 1 capsule (200 mg total) by mouth every morning. 09/05/24     metoprolol  tartrate (LOPRESSOR ) 25 MG tablet Take 1 tablet (25 mg total) by mouth 2 (two) times daily. Patient not taking: Reported on 03/15/2019 04/21/18 01/20/20  Adella Norris, MD    Allergies: Patient has no known allergies.  Review of Systems  Constitutional:  Positive for chills and fatigue.  HENT:  Positive for congestion.     Updated Vital Signs BP (!) 140/80   Pulse 72   Temp 98.3 F (36.8 C)   Resp 18   Ht 6' (1.829 m)   Wt 90.7 kg   SpO2 100%   BMI 27.12 kg/m   Physical Exam  (all labs ordered are listed, but only abnormal results are displayed) Labs Reviewed  CBC WITH DIFFERENTIAL/PLATELET - Abnormal; Notable for the following components:      Result Value   Monocytes Absolute 1.1 (*)    All other components within normal limits  BASIC METABOLIC PANEL WITH GFR - Abnormal; Notable for the following components:   Glucose, Bld 141 (*)    All other components within normal limits  RESP PANEL BY RT-PCR (RSV, FLU A&B,  COVID)  RVPGX2    EKG: None  Radiology: No results found.   Procedures   Medications Ordered in the ED - No data to display                                  Medical Decision Making Amount and/or Complexity of Data Reviewed Labs: ordered.  Risk OTC drugs.   This patient presents to the ED for concern of URI symptoms differential diagnosis includes COVID, flu, RSV, viral URI   Lab Tests:  I Ordered, and personally interpreted labs.  The pertinent results include: BMP unremarkable, CBC unremarkable, respiratory panel negative  Problem List / ED Course:  Considered for admission or further workup however patients vital signs, physical exam, labs, and imaging are reassuring. Patient's symptoms likely due to upper respiratory infection. Patient advised to take Tylenol /Motrin  as needed for fever, Flonase as needed for nasal congestion, and plain Mucinex  as needed for chest congestion. Patient should follow-up with their primary care in the upcoming week if their symptoms persist for further evaluation and workup.      Final diagnoses:  Viral illness    ED Discharge Orders          Ordered    acetaminophen  (TYLENOL ) 500 MG tablet  Every 6 hours PRN        09/28/24 0142               Francis Ileana SAILOR, PA-C 09/28/24 0145    Jerral Meth, MD 09/28/24 0247  "

## 2024-09-28 NOTE — Discharge Instructions (Signed)
 Today you were seen for a viral infection.  You may alternate taking Tylenol  and Motrin  as needed for fever and pain, Flonase as needed for nasal congestion, and plain Mucinex  as needed for chest congestion.  Thank you for letting us  treat you today. After reviewing your labs and imaging, I feel you are safe to go home. Please follow up with your PCP in the next several days and provide them with your records from this visit. Return to the Emergency Room if pain becomes severe or symptoms worsen.

## 2024-10-01 ENCOUNTER — Other Ambulatory Visit: Payer: Self-pay

## 2024-10-01 ENCOUNTER — Emergency Department (HOSPITAL_COMMUNITY)
Admission: EM | Admit: 2024-10-01 | Discharge: 2024-10-01 | Discharge status: Against Medical Advice | Attending: Emergency Medicine | Admitting: Emergency Medicine

## 2024-10-01 ENCOUNTER — Encounter (HOSPITAL_COMMUNITY): Payer: Self-pay

## 2024-10-01 DIAGNOSIS — K5900 Constipation, unspecified: Secondary | ICD-10-CM | POA: Diagnosis present

## 2024-10-01 DIAGNOSIS — Z5321 Procedure and treatment not carried out due to patient leaving prior to being seen by health care provider: Secondary | ICD-10-CM | POA: Diagnosis not present

## 2024-10-01 NOTE — ED Notes (Signed)
 Pt decided he was leaving. Pt seen walking out of triage.

## 2024-10-01 NOTE — ED Triage Notes (Signed)
 Pt states constipated for 3-4 hours but states had a large BM 10 min ago. Axox4. Denies abd pain, n/v.

## 2024-10-07 ENCOUNTER — Other Ambulatory Visit: Payer: Self-pay

## 2024-10-07 ENCOUNTER — Emergency Department (HOSPITAL_COMMUNITY)
Admission: EM | Admit: 2024-10-07 | Discharge: 2024-10-07 | Disposition: A | Discharge status: Home / Self Care | Attending: Emergency Medicine | Admitting: Emergency Medicine

## 2024-10-07 ENCOUNTER — Encounter (HOSPITAL_COMMUNITY): Payer: Self-pay | Admitting: *Deleted

## 2024-10-07 DIAGNOSIS — Z7982 Long term (current) use of aspirin: Secondary | ICD-10-CM | POA: Diagnosis not present

## 2024-10-07 DIAGNOSIS — J069 Acute upper respiratory infection, unspecified: Secondary | ICD-10-CM | POA: Insufficient documentation

## 2024-10-07 DIAGNOSIS — R059 Cough, unspecified: Secondary | ICD-10-CM | POA: Diagnosis present

## 2024-10-07 LAB — RESP PANEL BY RT-PCR (RSV, FLU A&B, COVID)  RVPGX2
Influenza A by PCR: NEGATIVE
Influenza B by PCR: NEGATIVE
Resp Syncytial Virus by PCR: NEGATIVE
SARS Coronavirus 2 by RT PCR: NEGATIVE

## 2024-10-07 NOTE — Discharge Instructions (Signed)
 Home to rest and hydrate. Tylenol  as needed for chills and body aches. Recheck with your primary care provider as needed.

## 2024-10-07 NOTE — ED Triage Notes (Signed)
 Onset of generalized bodyaches and 'sniffles' that started today. Denies recent sick contacts or fever

## 2024-10-07 NOTE — ED Provider Notes (Signed)
 " [place] [facility] AT [facility] Provider Note   CSN: [id number] Arrival date & time: 10/07/24  9862     Patient presents with: Generalized Body Aches   Dylan Burgess is a 64 y.o. male.   64 yo male with complaint of body aches, chills, mild cough and runny nose. Onset yesterday, no specific sick contacts. No changes in bowel habits, nausea, vomiting. No other concerns.        Prior to Admission medications  Medication Sig Start Date End Date Taking? Authorizing Provider  acetaminophen  (TYLENOL ) 500 MG tablet Take 1 tablet (500 mg total) by mouth every 6 (six) hours as needed. 09/28/24   Keith, Kayla N, PA-C  amLODipine  (NORVASC ) 5 MG tablet Take 1 tablet (5 mg total) by mouth daily. 04/06/24   Newlin, Enobong, MD  aspirin  EC 81 MG tablet Take 1 tablet (81 mg total) by mouth daily. Swallow whole. 01/11/24   Judithe Rocky BROCKS, NP  atomoxetine  (STRATTERA ) 40 MG capsule Take 1 capsule (40 mg total) by mouth daily. 08/26/23     buPROPion  (WELLBUTRIN  XL) 150 MG 24 hr tablet Take 1 tablet (150 mg total) by mouth in the morning. 09/05/24     chlorhexidine  (PERIDEX ) 0.12 % solution Swish 1 capful  for 1 (one) minute then spit, 2 (two) times daily for 10 days. Do not swallow. 01/20/24     famotidine  (PEPCID ) 40 MG tablet Take 1 tablet (40 mg total) by mouth daily. 12/10/23   Regalado, Belkys A, MD  guanFACINE  (INTUNIV ) 1 MG TB24 ER tablet Take 1 tablet (1 mg total) by mouth at bedtime. Patient not taking: Reported on 07/07/2024 04/26/24     guanFACINE  (INTUNIV ) 2 MG TB24 ER tablet Take 1 tablet (2 mg total) by mouth at bedtime. Patient not taking: Reported on 07/07/2024 07/06/24     ibuprofen  (ADVIL ) 800 MG tablet Take 1 tablet every 8 hours as needed for pain 01/20/24     LORazepam  (ATIVAN ) 0.5 MG tablet Take 0.5-1 tablets (0.25-0.5 mg total) by mouth 2 (two) times daily as needed. Patient not taking: Reported on 07/07/2024 10/07/23     LORazepam  (ATIVAN ) 0.5 MG  tablet Take 0.5-1 tablets (0.25-0.5 mg total) by mouth daily as needed FOR SEVERE ANXIETY/PANIC. Patient not taking: Reported on 07/07/2024 04/29/24     losartan  (COZAAR ) 100 MG tablet Take 1 tablet (100 mg total) by mouth daily. 04/06/24   Newlin, Enobong, MD  metoCLOPramide  (REGLAN ) 10 MG tablet Take 1 tablet (10 mg total) by mouth every 6 (six) hours. 03/24/24   Bauer, Collin S, PA-C  naproxen  (NAPROSYN ) 500 MG tablet Take 1 tablet (500 mg total) by mouth 2 (two) times daily. 03/24/24   Bauer, Collin S, PA-C  propranolol  (INDERAL ) 10 MG tablet Take 1 tablet by mouth twice daily 08/18/24     rosuvastatin  (CRESTOR ) 40 MG tablet Take 1 tablet (40 mg total) by mouth daily. 04/06/24   Newlin, Enobong, MD  Sodium Fluoride  (SODIUM FLUORIDE  5000 PPM) 1.1 % PSTE Apply to teeth in the morning and at bedtime. 06/30/24     viloxazine ER (QELBREE ) 200 MG 24 hr capsule Take 1 capsule (200 mg total) by mouth every morning. 09/05/24     metoprolol  tartrate (LOPRESSOR ) 25 MG tablet Take 1 tablet (25 mg total) by mouth 2 (two) times daily. Patient not taking: Reported on 03/15/2019 04/21/18 01/20/20  Adella Norris, MD    Allergies: Patient has no known allergies.    Review of Systems  Negative except as per HPI Updated Vital Signs BP (!) 140/91 (BP Location: Right Arm)   Pulse 88   Temp (!) 97.5 F (36.4 C) (Oral)   Resp 17   SpO2 100%   Physical Exam Vitals and nursing note reviewed.  Constitutional:      General: He is not in acute distress.    Appearance: He is well-developed. He is not diaphoretic.  HENT:     Head: Normocephalic and atraumatic.     Nose: Nose normal.     Mouth/Throat:     Mouth: Mucous membranes are moist.     Pharynx: No oropharyngeal exudate or posterior oropharyngeal erythema.  Cardiovascular:     Rate and Rhythm: Normal rate and regular rhythm.     Heart sounds: Normal heart sounds.  Pulmonary:     Effort: Pulmonary effort is normal.     Breath sounds: Normal breath  sounds.  Musculoskeletal:     Cervical back: Neck supple.  Skin:    General: Skin is warm and dry.     Findings: No erythema or rash.  Neurological:     Mental Status: He is alert and oriented to person, place, and time.  Psychiatric:        Behavior: Behavior normal.     (all labs ordered are listed, but only abnormal results are displayed) Labs Reviewed  RESP PANEL BY RT-PCR (RSV, FLU A&B, COVID)  RVPGX2    EKG: None  Radiology: No results found.   Procedures   Medications Ordered in the ED - No data to display                                  Medical Decision Making  64 year old male with concern for body aches, chills, mild cough and runny nose.  Well-appearing and nontoxic on exam.  Lungs clear to auscultation.  Negative for COVID, flu, RSV.  Suspect viral illness.  Recommend symptomatic management at home and follow-up with PCP as needed.     Final diagnoses:  Viral upper respiratory tract infection    ED Discharge Orders     None          Beverley Leita DELENA DEVONNA 10/07/24 0509    Raford Lenis, MD 10/07/24 [phone]  "

## 2024-10-17 ENCOUNTER — Other Ambulatory Visit (HOSPITAL_COMMUNITY): Payer: Self-pay

## 2024-10-17 ENCOUNTER — Other Ambulatory Visit: Payer: Self-pay

## 2024-10-17 ENCOUNTER — Ambulatory Visit: Admitting: Neurology

## 2024-10-19 ENCOUNTER — Other Ambulatory Visit: Payer: Self-pay

## 2024-10-19 ENCOUNTER — Emergency Department (HOSPITAL_COMMUNITY)
Admission: EM | Admit: 2024-10-19 | Discharge: 2024-10-19 | Discharge status: Against Medical Advice | Attending: Emergency Medicine | Admitting: Emergency Medicine

## 2024-10-19 DIAGNOSIS — R5383 Other fatigue: Secondary | ICD-10-CM | POA: Insufficient documentation

## 2024-10-19 DIAGNOSIS — J029 Acute pharyngitis, unspecified: Secondary | ICD-10-CM | POA: Insufficient documentation

## 2024-10-19 DIAGNOSIS — M7918 Myalgia, other site: Secondary | ICD-10-CM | POA: Insufficient documentation

## 2024-10-19 DIAGNOSIS — Z5321 Procedure and treatment not carried out due to patient leaving prior to being seen by health care provider: Secondary | ICD-10-CM | POA: Insufficient documentation

## 2024-10-19 LAB — COMPREHENSIVE METABOLIC PANEL WITH GFR
ALT: 53 U/L — ABNORMAL HIGH (ref 0–44)
AST: 37 U/L (ref 15–41)
Albumin: 4.3 g/dL (ref 3.5–5.0)
Alkaline Phosphatase: 87 U/L (ref 38–126)
Anion gap: 11 (ref 5–15)
BUN: 15 mg/dL (ref 8–23)
CO2: 25 mmol/L (ref 22–32)
Calcium: 8.9 mg/dL (ref 8.9–10.3)
Chloride: 104 mmol/L (ref 98–111)
Creatinine, Ser: 0.96 mg/dL (ref 0.61–1.24)
GFR, Estimated: >60 mL/min
Glucose, Bld: 105 mg/dL — ABNORMAL HIGH (ref 70–99)
Potassium: 3.6 mmol/L (ref 3.5–5.1)
Sodium: 140 mmol/L (ref 135–145)
Total Bilirubin: 0.5 mg/dL (ref 0.0–1.2)
Total Protein: 7.5 g/dL (ref 6.5–8.1)

## 2024-10-19 LAB — CBC WITH DIFFERENTIAL/PLATELET
Abs Immature Granulocytes: 0.04 10*3/uL (ref 0.00–0.07)
Basophils Absolute: 0 10*3/uL (ref 0.0–0.1)
Basophils Relative: 0 %
Eosinophils Absolute: 0.2 10*3/uL (ref 0.0–0.5)
Eosinophils Relative: 2 %
HCT: 45.2 % (ref 39.0–52.0)
Hemoglobin: 14.9 g/dL (ref 13.0–17.0)
Immature Granulocytes: 0 %
Lymphocytes Relative: 24 %
Lymphs Abs: 2.8 10*3/uL (ref 0.7–4.0)
MCH: 27.2 pg (ref 26.0–34.0)
MCHC: 33 g/dL (ref 30.0–36.0)
MCV: 82.6 fL (ref 80.0–100.0)
Monocytes Absolute: 1.1 10*3/uL — ABNORMAL HIGH (ref 0.1–1.0)
Monocytes Relative: 9 %
Neutro Abs: 7.4 10*3/uL (ref 1.7–7.7)
Neutrophils Relative %: 65 %
Platelets: 265 10*3/uL (ref 150–400)
RBC: 5.47 MIL/uL (ref 4.22–5.81)
RDW: 13.6 % (ref 11.5–15.5)
WBC: 11.5 10*3/uL — ABNORMAL HIGH (ref 4.0–10.5)
nRBC: 0 % (ref 0.0–0.2)

## 2024-10-19 LAB — GROUP A STREP BY PCR: Group A Strep by PCR: NOT DETECTED

## 2024-10-19 NOTE — ED Triage Notes (Signed)
 Patient reports sore throat with fatigue and body aches this week .

## 2025-01-05 ENCOUNTER — Ambulatory Visit: Admitting: Family Medicine
